# Patient Record
Sex: Male | Born: 1990 | Race: White | Hispanic: No | Marital: Single | Smoking: Never smoker
Health system: Southern US, Community
[De-identification: ages and names within clinical notes are randomized; demographics above are authoritative.]

## PROBLEM LIST (undated history)

## (undated) DIAGNOSIS — J449 Chronic obstructive pulmonary disease, unspecified: Secondary | ICD-10-CM

## (undated) DIAGNOSIS — D839 Common variable immunodeficiency, unspecified: Secondary | ICD-10-CM

## (undated) DIAGNOSIS — J479 Bronchiectasis, uncomplicated: Secondary | ICD-10-CM

---

## 2016-04-14 ENCOUNTER — Emergency Department: Payer: Medicare Other

## 2016-04-14 ENCOUNTER — Encounter: Payer: Self-pay | Admitting: Emergency Medicine

## 2016-04-14 ENCOUNTER — Inpatient Hospital Stay
Admission: EM | Admit: 2016-04-14 | Discharge: 2016-04-22 | DRG: 189 | Disposition: A | Discharge status: Home / Self Care | Payer: Medicare Other | Attending: Internal Medicine | Admitting: Internal Medicine

## 2016-04-14 DIAGNOSIS — Y95 Nosocomial condition: Secondary | ICD-10-CM | POA: Diagnosis present

## 2016-04-14 DIAGNOSIS — Z79891 Long term (current) use of opiate analgesic: Secondary | ICD-10-CM

## 2016-04-14 DIAGNOSIS — J9809 Other diseases of bronchus, not elsewhere classified: Secondary | ICD-10-CM | POA: Diagnosis present

## 2016-04-14 DIAGNOSIS — J9620 Acute and chronic respiratory failure, unspecified whether with hypoxia or hypercapnia: Secondary | ICD-10-CM | POA: Diagnosis present

## 2016-04-14 DIAGNOSIS — R1310 Dysphagia, unspecified: Secondary | ICD-10-CM

## 2016-04-14 DIAGNOSIS — J189 Pneumonia, unspecified organism: Secondary | ICD-10-CM | POA: Diagnosis present

## 2016-04-14 DIAGNOSIS — F329 Major depressive disorder, single episode, unspecified: Secondary | ICD-10-CM | POA: Diagnosis present

## 2016-04-14 DIAGNOSIS — J9622 Acute and chronic respiratory failure with hypercapnia: Secondary | ICD-10-CM | POA: Diagnosis present

## 2016-04-14 DIAGNOSIS — F419 Anxiety disorder, unspecified: Secondary | ICD-10-CM | POA: Diagnosis present

## 2016-04-14 DIAGNOSIS — J9621 Acute and chronic respiratory failure with hypoxia: Principal | ICD-10-CM | POA: Diagnosis present

## 2016-04-14 DIAGNOSIS — E874 Mixed disorder of acid-base balance: Secondary | ICD-10-CM | POA: Diagnosis not present

## 2016-04-14 DIAGNOSIS — K5900 Constipation, unspecified: Secondary | ICD-10-CM | POA: Diagnosis not present

## 2016-04-14 DIAGNOSIS — Z9981 Dependence on supplemental oxygen: Secondary | ICD-10-CM | POA: Diagnosis not present

## 2016-04-14 DIAGNOSIS — R262 Difficulty in walking, not elsewhere classified: Secondary | ICD-10-CM

## 2016-04-14 DIAGNOSIS — D839 Common variable immunodeficiency, unspecified: Secondary | ICD-10-CM | POA: Diagnosis present

## 2016-04-14 DIAGNOSIS — J471 Bronchiectasis with (acute) exacerbation: Secondary | ICD-10-CM | POA: Diagnosis present

## 2016-04-14 DIAGNOSIS — G8929 Other chronic pain: Secondary | ICD-10-CM | POA: Diagnosis present

## 2016-04-14 DIAGNOSIS — E46 Unspecified protein-calorie malnutrition: Secondary | ICD-10-CM | POA: Diagnosis present

## 2016-04-14 DIAGNOSIS — R0602 Shortness of breath: Secondary | ICD-10-CM | POA: Diagnosis present

## 2016-04-14 DIAGNOSIS — J9600 Acute respiratory failure, unspecified whether with hypoxia or hypercapnia: Secondary | ICD-10-CM

## 2016-04-14 DIAGNOSIS — J9601 Acute respiratory failure with hypoxia: Secondary | ICD-10-CM

## 2016-04-14 DIAGNOSIS — K224 Dyskinesia of esophagus: Secondary | ICD-10-CM

## 2016-04-14 DIAGNOSIS — F411 Generalized anxiety disorder: Secondary | ICD-10-CM | POA: Diagnosis not present

## 2016-04-14 DIAGNOSIS — J470 Bronchiectasis with acute lower respiratory infection: Secondary | ICD-10-CM | POA: Diagnosis not present

## 2016-04-14 DIAGNOSIS — M6281 Muscle weakness (generalized): Secondary | ICD-10-CM

## 2016-04-14 DIAGNOSIS — J479 Bronchiectasis, uncomplicated: Secondary | ICD-10-CM

## 2016-04-14 HISTORY — DX: Common variable immunodeficiency, unspecified: D83.9

## 2016-04-14 HISTORY — DX: Chronic obstructive pulmonary disease, unspecified: J44.9

## 2016-04-14 HISTORY — DX: Bronchiectasis, uncomplicated: J47.9

## 2016-04-14 LAB — HEPATIC FUNCTION PANEL
ALBUMIN: 4 g/dL (ref 3.5–5.0)
ALT: 48 U/L (ref 17–63)
AST: 51 U/L — AB (ref 15–41)
Alkaline Phosphatase: 79 U/L (ref 38–126)
Bilirubin, Direct: <0.1 mg/dL — ABNORMAL LOW (ref 0.1–0.5)
TOTAL PROTEIN: 7.5 g/dL (ref 6.5–8.1)
Total Bilirubin: 0.4 mg/dL (ref 0.3–1.2)

## 2016-04-14 LAB — URINALYSIS COMPLETE WITH MICROSCOPIC (ARMC ONLY)
BACTERIA UA: NONE SEEN
BILIRUBIN URINE: NEGATIVE
GLUCOSE, UA: NEGATIVE mg/dL
Hgb urine dipstick: NEGATIVE
Ketones, ur: NEGATIVE mg/dL
Leukocytes, UA: NEGATIVE
Nitrite: NEGATIVE
Protein, ur: NEGATIVE mg/dL
SQUAMOUS EPITHELIAL / LPF: NONE SEEN
Specific Gravity, Urine: 1.006 (ref 1.005–1.030)
pH: 7 (ref 5.0–8.0)

## 2016-04-14 LAB — BASIC METABOLIC PANEL
ANION GAP: 4 — AB (ref 5–15)
BUN: 16 mg/dL (ref 6–20)
CALCIUM: 9.3 mg/dL (ref 8.9–10.3)
CHLORIDE: 97 mmol/L — AB (ref 101–111)
CO2: 35 mmol/L — AB (ref 22–32)
CREATININE: 0.73 mg/dL (ref 0.61–1.24)
GFR calc Af Amer: >60 mL/min (ref >60)
GFR calc non Af Amer: >60 mL/min (ref >60)
GLUCOSE: 104 mg/dL — AB (ref 65–99)
Potassium: 4.6 mmol/L (ref 3.5–5.1)
Sodium: 136 mmol/L (ref 135–145)

## 2016-04-14 LAB — BLOOD GAS, VENOUS
Acid-Base Excess: 8.8 mmol/L — ABNORMAL HIGH (ref 0.0–2.0)
BICARBONATE: 37 mmol/L — AB (ref 20.0–28.0)
Delivery systems: POSITIVE
FIO2: 0.3
MECHANICAL RATE: 10
O2 SAT: 95.4 %
PATIENT TEMPERATURE: 37
PEEP/CPAP: 5 cmH2O
PO2 VEN: 82 mmHg — AB (ref 32.0–45.0)
Pressure support: 10 cmH2O
RATE: 10 resp/min
pCO2, Ven: 67 mmHg — ABNORMAL HIGH (ref 44.0–60.0)
pH, Ven: 7.35 (ref 7.250–7.430)

## 2016-04-14 LAB — CBC
HCT: 45.1 % (ref 40.0–52.0)
HEMOGLOBIN: 15.6 g/dL (ref 13.0–18.0)
MCH: 28.8 pg (ref 26.0–34.0)
MCHC: 34.5 g/dL (ref 32.0–36.0)
MCV: 83.3 fL (ref 80.0–100.0)
Platelets: 252 10*3/uL (ref 150–440)
RBC: 5.41 MIL/uL (ref 4.40–5.90)
RDW: 14.2 % (ref 11.5–14.5)
WBC: 9.8 10*3/uL (ref 3.8–10.6)

## 2016-04-14 LAB — MRSA PCR SCREENING: MRSA BY PCR: NEGATIVE

## 2016-04-14 LAB — TROPONIN I

## 2016-04-14 LAB — LACTIC ACID, PLASMA: Lactic Acid, Venous: 0.7 mmol/L (ref 0.5–1.9)

## 2016-04-14 MED ORDER — ASPIRIN 81 MG PO CHEW: 324.0000 mg | CHEWABLE_TABLET | ORAL | Status: DC

## 2016-04-14 MED ORDER — PREDNISONE 10 MG PO TABS
10.0000 mg | ORAL_TABLET | Freq: Every day | ORAL | Status: DC
  Administered 2016-04-15: 10 mg via ORAL
  Filled 2016-04-14 (×2): qty 1

## 2016-04-14 MED ORDER — CHLORHEXIDINE GLUCONATE 0.12 % MT SOLN
15.0000 mL | Freq: Two times a day (BID) | OROMUCOSAL | Status: DC
  Administered 2016-04-14 – 2016-04-15 (×2): 15 mL via OROMUCOSAL

## 2016-04-14 MED ORDER — ASPIRIN EC 81 MG PO TBEC
81.0000 mg | DELAYED_RELEASE_TABLET | Freq: Every day | ORAL | Status: DC
  Administered 2016-04-15 – 2016-04-16 (×2): 81 mg via ORAL
  Filled 2016-04-14 (×3): qty 1

## 2016-04-14 MED ORDER — ALBUTEROL SULFATE (2.5 MG/3ML) 0.083% IN NEBU
5.0000 mg | INHALATION_SOLUTION | Freq: Once | RESPIRATORY_TRACT | Status: AC
  Administered 2016-04-14: 5 mg via RESPIRATORY_TRACT
  Filled 2016-04-14: qty 6

## 2016-04-14 MED ORDER — VENLAFAXINE HCL ER 37.5 MG PO CP24
37.5000 mg | ORAL_CAPSULE | Freq: Every day | ORAL | Status: DC
  Administered 2016-04-15: 37.5 mg via ORAL
  Filled 2016-04-14 (×2): qty 1

## 2016-04-14 MED ORDER — METOPROLOL TARTRATE 5 MG/5ML IV SOLN
5.0000 mg | Freq: Once | INTRAVENOUS | Status: AC
  Administered 2016-04-14: 5 mg via INTRAVENOUS
  Filled 2016-04-14: qty 5

## 2016-04-14 MED ORDER — METHADONE HCL 10 MG/ML PO CONC
20.0000 mg | Freq: Every day | ORAL | Status: DC
  Administered 2016-04-15 – 2016-04-19 (×5): 20 mg
  Filled 2016-04-14 (×9): qty 2

## 2016-04-14 MED ORDER — FAMOTIDINE IN NACL 20-0.9 MG/50ML-% IV SOLN
20.0000 mg | Freq: Two times a day (BID) | INTRAVENOUS | Status: DC
  Administered 2016-04-14 – 2016-04-15 (×2): 20 mg via INTRAVENOUS
  Filled 2016-04-14 (×2): qty 50

## 2016-04-14 MED ORDER — LACTATED RINGERS IV SOLN
INTRAVENOUS | Status: DC
  Administered 2016-04-14 – 2016-04-16 (×2): via INTRAVENOUS

## 2016-04-14 MED ORDER — ASPIRIN 300 MG RE SUPP: 300.0000 mg | RECTAL | Status: DC

## 2016-04-14 MED ORDER — FENTANYL CITRATE (PF) 100 MCG/2ML IJ SOLN: 100.0000 ug | INTRAMUSCULAR | Status: DC | PRN

## 2016-04-14 MED ORDER — SODIUM CHLORIDE 0.9 % IV SOLN: 250.0000 mL | INTRAVENOUS | Status: DC | PRN

## 2016-04-14 MED ORDER — VANCOMYCIN HCL IN DEXTROSE 1-5 GM/200ML-% IV SOLN
1000.0000 mg | Freq: Once | INTRAVENOUS | Status: AC
  Administered 2016-04-14: 1000 mg via INTRAVENOUS
  Filled 2016-04-14: qty 200

## 2016-04-14 MED ORDER — LEVALBUTEROL HCL 0.63 MG/3ML IN NEBU
0.6300 mg | INHALATION_SOLUTION | RESPIRATORY_TRACT | Status: DC
  Administered 2016-04-15 (×3): 0.63 mg via RESPIRATORY_TRACT
  Filled 2016-04-14 (×3): qty 3

## 2016-04-14 MED ORDER — ACETAMINOPHEN 325 MG PO TABS
650.0000 mg | ORAL_TABLET | ORAL | Status: DC | PRN
  Administered 2016-04-17 – 2016-04-21 (×2): 650 mg via ORAL
  Filled 2016-04-14 (×2): qty 2

## 2016-04-14 MED ORDER — ONDANSETRON HCL 4 MG/2ML IJ SOLN
4.0000 mg | Freq: Four times a day (QID) | INTRAMUSCULAR | Status: DC | PRN
  Administered 2016-04-15 – 2016-04-16 (×3): 4 mg via INTRAVENOUS
  Filled 2016-04-14 (×3): qty 2

## 2016-04-14 MED ORDER — LEVALBUTEROL HCL 0.63 MG/3ML IN NEBU: 0.6300 mg | INHALATION_SOLUTION | Freq: Four times a day (QID) | RESPIRATORY_TRACT | Status: DC | PRN

## 2016-04-14 MED ORDER — ENOXAPARIN SODIUM 40 MG/0.4ML ~~LOC~~ SOLN
40.0000 mg | SUBCUTANEOUS | Status: DC
  Administered 2016-04-14 – 2016-04-21 (×8): 40 mg via SUBCUTANEOUS
  Filled 2016-04-14 (×8): qty 0.4

## 2016-04-14 MED ORDER — MORPHINE SULFATE (PF) 2 MG/ML IV SOLN
INTRAVENOUS | Status: AC
  Filled 2016-04-14: qty 1

## 2016-04-14 MED ORDER — MORPHINE SULFATE (PF) 4 MG/ML IV SOLN
1.0000 mg | INTRAVENOUS | Status: DC | PRN
  Administered 2016-04-14 – 2016-04-16 (×5): 2 mg via INTRAVENOUS
  Administered 2016-04-19: 1 mg via INTRAVENOUS
  Filled 2016-04-14 (×5): qty 1

## 2016-04-14 MED ORDER — ORAL CARE MOUTH RINSE: 15.0000 mL | Freq: Two times a day (BID) | OROMUCOSAL | Status: DC

## 2016-04-14 MED ORDER — IPRATROPIUM-ALBUTEROL 0.5-2.5 (3) MG/3ML IN SOLN
3.0000 mL | Freq: Once | RESPIRATORY_TRACT | Status: AC
  Administered 2016-04-14: 3 mL via RESPIRATORY_TRACT
  Filled 2016-04-14: qty 3

## 2016-04-14 MED ORDER — CEFEPIME-DEXTROSE 1 GM/50ML IV SOLR
1.0000 g | Freq: Once | INTRAVENOUS | Status: AC
  Administered 2016-04-14: 1 g via INTRAVENOUS
  Filled 2016-04-14: qty 50

## 2016-04-14 NOTE — ED Provider Notes (Signed)
[facility] Emergency Department Provider Note    First MD Initiated Contact with Patient 04/14/16 1833     (approximate)  I have reviewed the triage vital signs and the nursing notes.   HISTORY  Chief Complaint Cough and Shortness of Breath    HPI Derrick Maynard is a 26 y.o. male with a history of COPD as well as C VID and bronchiectasis on chronic Bactrim presents with several days of worsening shortness of breath productive cough and generalized malaise. He is typically on 2 L nasal cannula at home. Has been taking nebs every 6 hours. Does have access to BiPAP at home but has been using it nearly 24 7 for the past several days. He presents for worsening shortness of breath and inability to wean off of BiPAP.   Past Medical History:  Diagnosis Date  . Bronchiectasis (HCC)   . COPD (chronic obstructive pulmonary disease) (HCC)   . CVID (common variable immunodeficiency) (HCC)    family history of CVID No recent surgeries There are no active problems to display for this patient.     Prior to Admission medications   Not on File    Allergies Patient has no known allergies.    Social History Social History  Substance Use Topics  . Smoking status: Never Smoker  . Smokeless tobacco: Never Used  . Alcohol use No    Review of Systems Patient denies headaches, rhinorrhea, blurry vision, numbness, shortness of breath, chest pain, edema, cough, abdominal pain, nausea, vomiting, diarrhea, dysuria, fevers, rashes or hallucinations unless otherwise stated above in HPI. ____________________________________________   PHYSICAL EXAM:  VITAL SIGNS: Vitals:   04/14/16 1835 04/14/16 1900  BP: (!) 174/86 (!) 124/91  Pulse: (!) 120 (!) 109  Resp: (!) 24 18  Temp: 98.6 F (37 C)    Constitutional: Alert but in acute respiratory distress Eyes: Conjunctivae are normal. PERRL. EOMI. Head: Atraumatic. Nose: No congestion/rhinnorhea. Mouth/Throat:  Mucous membranes are moist.  Oropharynx non-erythematous. Neck: No stridor. Painless ROM. No cervical spine tenderness to palpation Hematological/Lymphatic/Immunilogical: No cervical lymphadenopathy. Cardiovascular: tachycardic, regular rhythm. Grossly normal heart sounds.  Good peripheral circulation. Respiratory: markedly tachypneic with diffuse wheezing and rhonchorus breath sounds Gastrointestinal: Soft and nontender. No distention. No abdominal bruits. No CVA tenderness. Musculoskeletal: No lower extremity tenderness nor edema.  No joint effusions. Neurologic:  Normal speech and language. No gross focal neurologic deficits are appreciated. No gait instability. Skin:  Skin is warm, dry and intact. No rash noted. Psychiatric: Mood and affect are normal. Speech and behavior are normal.  ____________________________________________   LABS (all labs ordered are listed, but only abnormal results are displayed)  Results for orders placed or performed during the hospital encounter of 04/14/16 (from the past 24 hour(s))  Basic metabolic panel     Status: Abnormal   Collection Time: 04/14/16  6:42 PM  Result Value Ref Range   Sodium 136 135 - 145 mmol/L   Potassium 4.6 3.5 - 5.1 mmol/L   Chloride 97 (L) 101 - 111 mmol/L   CO2 35 (H) 22 - 32 mmol/L   Glucose, Bld 104 (H) 65 - 99 mg/dL   BUN 16 6 - 20 mg/dL   Creatinine, Ser 5.40 0.61 - 1.24 mg/dL   Calcium 9.3 8.9 - 98.1 mg/dL   GFR calc non Af Amer >60 >60 mL/min   GFR calc Af Amer >60 >60 mL/min   Anion gap 4 (L) 5 - 15  CBC  Status: None   Collection Time: 04/14/16  6:42 PM  Result Value Ref Range   WBC 9.8 3.8 - 10.6 K/uL   RBC 5.41 4.40 - 5.90 MIL/uL   Hemoglobin 15.6 13.0 - 18.0 g/dL   HCT 16.145.1 09.640.0 - 04.552.0 %   MCV 83.3 80.0 - 100.0 fL   MCH 28.8 26.0 - 34.0 pg   MCHC 34.5 32.0 - 36.0 g/dL   RDW 40.914.2 81.111.5 - 91.414.5 %   Platelets 252 150 - 440 K/uL  Troponin I     Status: None   Collection Time: 04/14/16  6:42 PM  Result  Value Ref Range   Troponin I <0.03 <0.03 ng/mL  Hepatic function panel     Status: Abnormal   Collection Time: 04/14/16  7:01 PM  Result Value Ref Range   Total Protein 7.5 6.5 - 8.1 g/dL   Albumin 4.0 3.5 - 5.0 g/dL   AST 51 (H) 15 - 41 U/L   ALT 48 17 - 63 U/L   Alkaline Phosphatase 79 38 - 126 U/L   Total Bilirubin 0.4 0.3 - 1.2 mg/dL   Bilirubin, Direct <7.8<0.1 (L) 0.1 - 0.5 mg/dL   Indirect Bilirubin NOT CALCULATED 0.3 - 0.9 mg/dL  Blood gas, venous     Status: Abnormal   Collection Time: 04/14/16  7:12 PM  Result Value Ref Range   FIO2 0.30    Delivery systems BILEVEL POSITIVE AIRWAY PRESSURE    LHR 10 resp/min   Peep/cpap 5.0 cm H20   Pressure support 10 cm H20   pH, Ven 7.35 7.250 - 7.430   pCO2, Ven 67 (H) 44.0 - 60.0 mmHg   pO2, Ven 82.0 (H) 32.0 - 45.0 mmHg   Bicarbonate 37.0 (H) 20.0 - 28.0 mmol/L   Acid-Base Excess 8.8 (H) 0.0 - 2.0 mmol/L   O2 Saturation 95.4 %   Patient temperature 37.0    Collection site VENOUS    Sample type VENOUS    Mechanical Rate 10   Lactic acid, plasma     Status: None   Collection Time: 04/14/16  7:14 PM  Result Value Ref Range   Lactic Acid, Venous 0.7 0.5 - 1.9 mmol/L   ____________________________________________  EKG My review and personal interpretation at Time: 19:07   Indication: SOB  Rate: 100  Rhythm: sinus Axis: normal Other: no acute ischemic changes, normal intervals ____________________________________________  RADIOLOGY  I personally reviewed all radiographic images ordered to evaluate for the above acute complaints and reviewed radiology reports and findings.  These findings were personally discussed with the patient.  Please see medical record for radiology report. ____________________________________________   PROCEDURES  Procedure(s) performed: none Procedures    Critical Care performed: yes CRITICAL CARE Performed by: Willy EddyPatrick Dyllin Gulley   Total critical care time: 50 minutes  Critical care time was  exclusive of separately billable procedures and treating other patients.  Critical care was necessary to treat or prevent imminent or life-threatening deterioration.  Critical care was time spent personally by me on the following activities: development of treatment plan with patient and/or surrogate as well as nursing, discussions with consultants, evaluation of patient's response to treatment, examination of patient, obtaining history from patient or surrogate, ordering and performing treatments and interventions, ordering and review of laboratory studies, ordering and review of radiographic studies, pulse oximetry and re-evaluation of patient's condition.  ____________________________________________   INITIAL IMPRESSION / ASSESSMENT AND PLAN / ED COURSE  Pertinent labs & imaging results that were available during my  care of the patient were reviewed by me and considered in my medical decision making (see chart for details).  DDX: Asthma, copd, CHF, pna, ptx, malignancy, Pe, anemia   Drue Weltman is a 25 y.o. who presents to the ED with acute worsening shortness of breath.  Patient arrives afebrile but tachycardic and in moderate respiratory distress. Patient was placed on BiPAP upon arrival to the ER due to severe shortness of breath despite increasing supple oxygen to 4 L. Has diffuse rhonchorous breath sounds concerning for pneumonia. IV antibiotics ordered for healthcare sister pneumonia as the patient is artery on Bactrim. The eyes are treatments ordered.  Chest x-ray shows diffuse bronchiectasis with likely interval development of right apical pneumonia. Patient with chronic hypercapnia with retention. Symptoms mildly improving with BiPAP. A spoke with ICU physician for admission to the patient's acute respiratory failure with hypoxia.  Have discussed with the patient and available family all diagnostics and treatments performed thus far and all questions were answered to the best of my  ability. The patient demonstrates understanding and agreement with plan.   Clinical Course      ____________________________________________   FINAL CLINICAL IMPRESSION(S) / ED DIAGNOSES  Final diagnoses:  SOB (shortness of breath)  Acute respiratory failure with hypoxia (HCC)      NEW MEDICATIONS STARTED DURING THIS VISIT:  New Prescriptions   No medications on file     Note:  This document was prepared using Dragon voice recognition software and may include unintentional dictation errors.    Willy EddyPatrick Tyesha Joffe, MD 04/14/16 [phone]

## 2016-04-14 NOTE — H&P (Signed)
PULMONARY / CRITICAL CARE MEDICINE   Name: Derrick Maynard MRN: [id number] DOB: 05/14/1991    ADMISSION DATE:  04/14/2016 CONSULTATION DATE:  04/14/2016  REFERRING MD:  Dr. 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  Per notes from [facility] pts lung disease was managed in [place] and he was evaluated twice for [facility], however he was denied due to tracheal pathology.  His sister has identical lung disease and his father died of complications from genetic pulmonary disease in his 7130's.  [place] and [facility] care with [facility] Pulmonologist Dr. Renaldo ReelHuang who saw him on 02/07/2016.  [facility] ER on 11/13 with c/o worsening shortness of breath, frequent productive cough, and generalized malaise over the last several days.  According to pts mother she contacted his pulmonologist on 11/3 due to respiratory symptoms and he was subsequently prescribed a 12 day prednisone taper (completion will be 11/14) and instructed to take bactrim for 14 days.  His mother stated due to respiratory symptoms she increased his O2 from 2L to 4L and noticed improvement of symptoms on 11/12.  However, 11/13 she noticed the pt was in respiratory distress she initially thought the pt had a buildup of CO2 due to  increased oxygen at 4L via nasal canula and placed him on continunous Bipap. She also administered 4 breathing treatments at home without relief of symptoms prompting visit to the ER on 11/13.  PCCM contacted to admit pt due to acute on chronic hypercapnic hypoxic respiratory failure secondary to Acute Exacerbation of Bronchiectasis vs. ?HCAP.  PAST MEDICAL HISTORY :  He  has a past medical history of Bronchiectasis (HCC); COPD (chronic obstructive pulmonary disease) (HCC); and CVID (common variable immunodeficiency) (HCC).  PAST SURGICAL HISTORY: He  has no past surgical history on file.  No Known Allergies  No current facility-administered medications on file prior to encounter.    No current outpatient prescriptions on file prior to encounter.    FAMILY HISTORY:  His has no family status information on file.    SOCIAL HISTORY: He  reports that he has never smoked. He has never used smokeless tobacco. He reports that he does not drink alcohol.  REVIEW OF SYSTEMS:  Positives in BOLD Gen: fever, chills, weight change, fatigue, night sweats HEENT: Denies blurred vision, double vision, hearing loss, tinnitus, sinus congestion, rhinorrhea, sore throat, neck stiffness, dysphagia PULM: shortness of breath, cough, sputum production, hemoptysis, wheezing CV: chest and bilateral flank pain, edema, orthopnea, paroxysmal nocturnal dyspnea, palpitations GI: Denies abdominal pain, nausea, vomiting, diarrhea, hematochezia, melena, constipation, change in bowel habits GU: Denies dysuria, hematuria, polyuria, oliguria, urethral discharge Endocrine: Denies hot or cold intolerance, polyuria, polyphagia or appetite change Derm: Denies rash, dry skin, scaling or peeling skin change Heme: Denies easy bruising, bleeding, bleeding gums Neuro: Denies headache, numbness, weakness, slurred speech, loss of memory  or consciousness  SUBJECTIVE:  Currently on Bipap states he has non radiating chest pain and  bilateral flank pain.  He also states his shortness of breath still remains the same on Bipap.  VITAL SIGNS: BP (!) 124/91   Pulse (!) 109   Temp 98.6 F (37 C) (Oral)   Resp 18   Ht 5\' 5"  (1.651 m)   Wt 125 lb (56.7 kg)   SpO2 98%   BMI 20.80 kg/m   HEMODYNAMICS:    VENTILATOR SETTINGS: FiO2 (%):  [30 %] 30 %  INTAKE / OUTPUT: No intake/output data recorded.  PHYSICAL EXAMINATION: General: Chronically ill appearing Caucasian male  Neuro:  Alert and oriented, following commands, PERRLA HEENT:  Supple, no JVD Cardiovascular:  Sinus tach, s1s2, no M/R/G Lungs:  Faint inspiratory wheezes throughout, slightly labored, even respirations on Bipap Abdomen:  +BS x4, soft, non tender, non distended, clamped micki feeding tube Musculoskeletal:  Normal bulk and tone Skin:  Pale, intact, no rashes or lesions  LABS:  BMET  Recent Labs Lab 04/14/16 1842  NA 136  K 4.6  CL 97*  CO2 35*  BUN 16  CREATININE 0.73  GLUCOSE 104*    Electrolytes  Recent Labs Lab 04/14/16 1842  CALCIUM 9.3    CBC  Recent Labs Lab 04/14/16 1842  WBC 9.8  HGB 15.6  HCT 45.1  PLT 252    Coag's No results for input(s): APTT, INR in the last 168 hours.  Sepsis Markers  Recent Labs Lab 04/14/16 1914  LATICACIDVEN 0.7    ABG No results for input(s): PHART, PCO2ART, PO2ART in the last 168 hours.  Liver Enzymes  Recent Labs Lab 04/14/16 1901  AST 51*  ALT 48  ALKPHOS 79  BILITOT 0.4  ALBUMIN 4.0    Cardiac Enzymes  Recent Labs Lab 04/14/16 1842  TROPONINI <0.03    Glucose No results for input(s): GLUCAP in the last 168 hours.  Imaging Dg Chest Port 1 View  Result Date: 04/14/2016 CLINICAL DATA:  Cough and progressive shortness of breath for 2-3 days. History of COPD, Common variable immunodeficiency. EXAM: PORTABLE CHEST 1 VIEW COMPARISON:  None. FINDINGS: LEFT lung base volume loss and scarring, possible small LEFT pleural effusion with LEFT apical  pleural capping and volume loss. Hyperinflated RIGHT lung. LEFT bronchiectasis. Strandy densities RIGHT upper lobe. Cardiomediastinal silhouette shifted to the LEFT. No pneumothorax. Single lumen RIGHT chest Port-A-Cath, looped at the level of brachiocephalic confluence, distal tip projecting in proximal superior vena cava. Soft tissue planes and included osseous structures are nonsuspicious. IMPRESSION: Bronchiectasis LEFT lung with pleural thickening, and/or small pleural effusion. LEFT lung atelectasis/ scarring. Strandy densities RIGHT upper lobe could be infectious or inflammatory, possible scarring. Electronically Signed   By: Awilda Metro M.D.   On: 04/14/2016 19:44   STUDIES:  None  CULTURES: Blood x2 11/13>> Sputum 11/13>> Urine 11/13>>  ANTIBIOTICS: Cefepime 11/13>> Vancomycin 11/13>>  SIGNIFICANT EVENTS: 11/13-Pt admitted to [facility] ICU due to acute on chronic hypercapnic hypoxic respiratory failure secondary to acute exacerbation of Bronchiectasis vs. ?HCAP  LINES/TUBES: Right chest portacath>> PIV 11/13>>  ASSESSMENT / PLAN:  PULMONARY A: Acute on chronic hypoxic hypercapnic respiratory failure secondary to acute exacerbation of Bronchiectasis vs. 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), Left mainstem bronchial stenosis, Severe obstructive airway disease (FEV1 16%), Respiratory colonization of MSSA and pseudonmonas, M abscessus s/p tx 2012, Chronic home O2 2-3L via nasal canula, and trilogy NIPPV at night P:   Continuous  Bipap for now wean as tolerated Maintain O2 sats >92% Continue scheduled and prn bronchodilators Chest physiotherapy q4hrs Continue outpatient prednisone CXR in am 11/13 Prn ABG's  CARDIOVASCULAR A:  Sinus tachycardia Chest pain P:  Trend troponin's Continuous telemetry monitoring LR @75  ml/hr Maintain map >65  RENAL A:   No acute issues P:   Trend BMP's Monitor UOP Replace  electrolytes as indicated  GASTROINTESTINAL A:   Protein calorie malnutrition associate with chronic respiratory illness P:   Keep NPO for now Pepcid for PUD prophylaxis Dietitian consult once pt off Bipap  HEMATOLOGIC A:   No acute issues P:  Lovenox for VTE prophylaxis Trend CBC's Monitor for s/sx of bleeding Transfuse for hgb <7  INFECTIOUS A:   Chronic immunodeficiency syndrome ?HCAP Hx: Respiratory colonization of MSSA and pseudonmonas, M abscessus s/p tx 2012 P:   Continue abx as listed above Trend WBC's and lactic acid Monitor fever curve Follow cultures  Check influenza panel  ENDOCRINE A:   No acute issues P:   Monitor serum glucose Hyper/hypglycemia protocol  NEUROLOGIC A:   Hx: Chronic Pain (followed by pain clinic, Depression P:   Continue outpatient effexor and methadone Prn Morphine for pain managemen Hold outpatient ativan, oxycodone, remeron, and klonopin for now   FAMILY  - Updates: Pts mother updated about plan of care and questions answered 04/14/16  - Inter-disciplinary family meet or Palliative Care meeting due by:  04/21/2016    Sonda Rumbleana Blakeney, Juel BurrowAGNP  Pulmonary/Critical Care Pager [phone] (please enter 7 digits) PCCM Consult Pager [phone] (please enter 7 digits)   Billy Fischeravid Kimmberly Wisser, MD PCCM service Mobile [phone] Pager [phone] 04/15/2016

## 2016-04-14 NOTE — ED Triage Notes (Signed)
Arrives via [facility] with C/O general sickness, productive cough, and progressively worsening SOB.  Patient has history of CVID, COPD and wears 2L/[place] home o2 and typically takes nebs Q6 hours.  Today patient states he has been taking back to back nebs and has increased his OXygen to 6l/[place]

## 2016-04-14 NOTE — ED Notes (Signed)
Pt brought in via ems from home with sob, cough.  Pt placed in hallway bed and moved to room 16.  Pt pale and is on 4 liters oxygen.  Pt alert.  Pt reports feeling bad for 3 days and worse today.  Pt has chest tightness and wheezing   Pt did 4 breathing treatments at home without much relief.  Mother with pt.   Pt has a micki feeding tube.  Pt states it is used intermittently for pill use but pt does eat food.

## 2016-04-15 ENCOUNTER — Inpatient Hospital Stay: Payer: Medicare Other

## 2016-04-15 DIAGNOSIS — J9622 Acute and chronic respiratory failure with hypercapnia: Secondary | ICD-10-CM

## 2016-04-15 DIAGNOSIS — F411 Generalized anxiety disorder: Secondary | ICD-10-CM

## 2016-04-15 DIAGNOSIS — J9621 Acute and chronic respiratory failure with hypoxia: Principal | ICD-10-CM

## 2016-04-15 DIAGNOSIS — J471 Bronchiectasis with (acute) exacerbation: Secondary | ICD-10-CM

## 2016-04-15 LAB — TROPONIN I: Troponin I: <0.03 ng/mL (ref <0.03)

## 2016-04-15 LAB — EXPECTORATED SPUTUM ASSESSMENT W GRAM STAIN, RFLX TO RESP C

## 2016-04-15 LAB — INFLUENZA PANEL BY PCR (TYPE A & B)
INFLAPCR: NEGATIVE
INFLBPCR: NEGATIVE

## 2016-04-15 LAB — CBC
HCT: 41.9 % (ref 40.0–52.0)
HEMOGLOBIN: 14.4 g/dL (ref 13.0–18.0)
MCH: 28.6 pg (ref 26.0–34.0)
MCHC: 34.3 g/dL (ref 32.0–36.0)
MCV: 83.5 fL (ref 80.0–100.0)
PLATELETS: 227 10*3/uL (ref 150–440)
RBC: 5.01 MIL/uL (ref 4.40–5.90)
RDW: 14.1 % (ref 11.5–14.5)
WBC: 11 10*3/uL — ABNORMAL HIGH (ref 3.8–10.6)

## 2016-04-15 LAB — BASIC METABOLIC PANEL
Anion gap: 6 (ref 5–15)
BUN: 13 mg/dL (ref 6–20)
CALCIUM: 9.2 mg/dL (ref 8.9–10.3)
CO2: 36 mmol/L — AB (ref 22–32)
CREATININE: 0.66 mg/dL (ref 0.61–1.24)
Chloride: 97 mmol/L — ABNORMAL LOW (ref 101–111)
GFR calc Af Amer: >60 mL/min (ref >60)
GFR calc non Af Amer: >60 mL/min (ref >60)
GLUCOSE: 87 mg/dL (ref 65–99)
Potassium: 4.5 mmol/L (ref 3.5–5.1)
Sodium: 139 mmol/L (ref 135–145)

## 2016-04-15 LAB — PHOSPHORUS: Phosphorus: 4 mg/dL (ref 2.5–4.6)

## 2016-04-15 LAB — LACTIC ACID, PLASMA: Lactic Acid, Venous: 0.8 mmol/L (ref 0.5–1.9)

## 2016-04-15 LAB — EXPECTORATED SPUTUM ASSESSMENT W REFEX TO RESP CULTURE

## 2016-04-15 LAB — MAGNESIUM: MAGNESIUM: 2 mg/dL (ref 1.7–2.4)

## 2016-04-15 LAB — GLUCOSE, CAPILLARY: Glucose-Capillary: 93 mg/dL (ref 65–99)

## 2016-04-15 LAB — STREP PNEUMONIAE URINARY ANTIGEN: Strep Pneumo Urinary Antigen: NEGATIVE

## 2016-04-15 MED ORDER — VANCOMYCIN HCL IN DEXTROSE 750-5 MG/150ML-% IV SOLN
750.0000 mg | Freq: Three times a day (TID) | INTRAVENOUS | Status: DC
  Administered 2016-04-15: 750 mg via INTRAVENOUS
  Filled 2016-04-15 (×3): qty 150

## 2016-04-15 MED ORDER — LEVALBUTEROL HCL 0.63 MG/3ML IN NEBU
0.6300 mg | INHALATION_SOLUTION | RESPIRATORY_TRACT | Status: DC | PRN
  Administered 2016-04-16 – 2016-04-17 (×2): 0.63 mg via RESPIRATORY_TRACT
  Filled 2016-04-15 (×2): qty 3

## 2016-04-15 MED ORDER — CEFEPIME-DEXTROSE 2 GM/50ML IV SOLR
2.0000 g | Freq: Three times a day (TID) | INTRAVENOUS | Status: DC
  Administered 2016-04-15 – 2016-04-16 (×4): 2 g via INTRAVENOUS
  Filled 2016-04-15 (×6): qty 50

## 2016-04-15 MED ORDER — ORAL CARE MOUTH RINSE
15.0000 mL | Freq: Two times a day (BID) | OROMUCOSAL | Status: DC
  Administered 2016-04-16 – 2016-04-21 (×10): 15 mL via OROMUCOSAL

## 2016-04-15 MED ORDER — LEVALBUTEROL HCL 0.63 MG/3ML IN NEBU
1.2500 mg | INHALATION_SOLUTION | Freq: Three times a day (TID) | RESPIRATORY_TRACT | Status: DC
  Administered 2016-04-15 – 2016-04-16 (×4): 1.25 mg via RESPIRATORY_TRACT
  Administered 2016-04-17: 1.26 mg via RESPIRATORY_TRACT
  Filled 2016-04-15 (×9): qty 6

## 2016-04-15 MED ORDER — CLONAZEPAM 0.5 MG PO TABS
0.5000 mg | ORAL_TABLET | Freq: Every day | ORAL | Status: DC
  Administered 2016-04-15 – 2016-04-18 (×5): 0.5 mg via ORAL
  Filled 2016-04-15 (×4): qty 1

## 2016-04-15 MED ORDER — METHYLPREDNISOLONE SODIUM SUCC 40 MG IJ SOLR
40.0000 mg | Freq: Two times a day (BID) | INTRAMUSCULAR | Status: DC
  Administered 2016-04-15 – 2016-04-16 (×4): 40 mg via INTRAVENOUS
  Filled 2016-04-15 (×5): qty 1

## 2016-04-15 MED ORDER — FAMOTIDINE 20 MG PO TABS
20.0000 mg | ORAL_TABLET | Freq: Two times a day (BID) | ORAL | Status: DC
  Administered 2016-04-15 – 2016-04-16 (×3): 20 mg via ORAL
  Filled 2016-04-15 (×4): qty 1

## 2016-04-15 MED ORDER — CLONAZEPAM 0.5 MG PO TABS
0.2500 mg | ORAL_TABLET | Freq: Every morning | ORAL | Status: DC
  Administered 2016-04-15 – 2016-04-19 (×5): 0.25 mg via ORAL
  Filled 2016-04-15 (×6): qty 1

## 2016-04-15 MED ORDER — DEXTROSE 5 % IV SOLN
2.0000 g | Freq: Three times a day (TID) | INTRAVENOUS | Status: DC
  Administered 2016-04-15: 2 g via INTRAVENOUS
  Filled 2016-04-15 (×3): qty 2

## 2016-04-15 MED ORDER — VENLAFAXINE HCL ER 75 MG PO CP24
75.0000 mg | ORAL_CAPSULE | Freq: Every day | ORAL | Status: DC
  Administered 2016-04-16 – 2016-04-22 (×7): 75 mg via ORAL
  Filled 2016-04-15 (×7): qty 1

## 2016-04-15 NOTE — Progress Notes (Signed)
Mildly dyspneic when off BiPAP. Very anxious. Denies CP, fever, purulent sputum, hemoptysis, LE edema and calf tenderness.   Vitals:   04/15/16 0852 04/15/16 0900 04/15/16 1000 04/15/16 1100  BP:  (!) 142/89 129/86 129/88  Pulse:  (!) 106 (!) 101 (!) 102  Resp:  20 17 19   Temp:      TempSrc:      SpO2: 100% (!) 89% 100% 99%  Weight:      Height:       Mildly tachypneic HEENT WNL Mild pseudowheeze No true wheezes Diminished BS on L compared to R Reg, no M NABS No C/C/E  BMET    Component Value Date/Time   NA 139 04/15/2016 0427   K 4.5 04/15/2016 0427   CL 97 (L) 04/15/2016 0427   CO2 36 (H) 04/15/2016 0427   GLUCOSE 87 04/15/2016 0427   BUN 13 04/15/2016 0427   CREATININE 0.66 04/15/2016 0427   CALCIUM 9.2 04/15/2016 0427   GFRNONAA >60 04/15/2016 0427   GFRAA >60 04/15/2016 0427   CBC    Component Value Date/Time   WBC 11.0 (H) 04/15/2016 0427   RBC 5.01 04/15/2016 0427   HGB 14.4 04/15/2016 0427   HCT 41.9 04/15/2016 0427   PLT 227 04/15/2016 0427   MCV 83.5 04/15/2016 0427   MCH 28.6 04/15/2016 0427   MCHC 34.3 04/15/2016 0427   RDW 14.1 04/15/2016 0427   Results for orders placed or performed during the hospital encounter of 04/14/16  Blood culture (routine x 2)     Status: None (Preliminary result)   Collection Time: 04/14/16  7:25 PM  Result Value Ref Range Status   Specimen Description BLOOD PORTA CATH  Final   Special Requests BOTTLES DRAWN AEROBIC AND ANAEROBIC 8CC  Final   Culture NO GROWTH < 12 HOURS  Final   Report Status PENDING  Incomplete  Blood culture (routine x 2)     Status: None (Preliminary result)   Collection Time: 04/14/16  7:55 PM  Result Value Ref Range Status   Specimen Description BLOOD PORTA CATH  Final   Special Requests BOTTLES DRAWN AEROBIC AND ANAEROBIC 8CC  Final   Culture NO GROWTH < 12 HOURS  Final   Report Status PENDING  Incomplete  MRSA PCR Screening     Status: None   Collection Time: 04/14/16 10:20 PM  Result  Value Ref Range Status   MRSA by PCR NEGATIVE NEGATIVE Final    Comment:        The GeneXpert MRSA Assay (FDA approved for NASAL specimens only), is one component of a comprehensive MRSA colonization surveillance program. It is not intended to diagnose MRSA infection nor to guide or monitor treatment for MRSA infections.    Anti-infectives    Start     Dose/Rate Route Frequency Ordered Stop   04/15/16 1400  ceFEPIme (MAXIPIME) 2 GM / 50mL IVPB premix     2 g 100 mL/hr over 30 Minutes Intravenous Every 8 hours 04/15/16 0854     04/15/16 0600  ceFEPIme (MAXIPIME) 2 g in dextrose 5 % 50 mL IVPB  Status:  Discontinued     2 g 100 mL/hr over 30 Minutes Intravenous Every 8 hours 04/15/16 0301 04/15/16 0854   04/15/16 0400  vancomycin (VANCOCIN) IVPB 750 mg/150 ml premix  Status:  Discontinued     750 mg 150 mL/hr over 60 Minutes Intravenous Every 8 hours 04/15/16 0125 04/15/16 1150   04/14/16 1945  ceFEPIme (MAXIPIME) 1 GM /  50mL IVPB premix     1 g 100 mL/hr over 30 Minutes Intravenous  Once 04/14/16 1933 04/14/16 2034   04/14/16 1945  vancomycin (VANCOCIN) IVPB 1000 mg/200 mL premix     1,000 mg 200 mL/hr over 60 Minutes Intravenous  Once 04/14/16 1942 04/14/16 2105      CXR: NSC chronic appearing changes on L with volume loss  IMPRESSION: CVID Bronchiectasis Acute on chronic hypoxemic/hpercarbic respiratory failure Anxiety  PLAN: Cont PRN BiPAP Cont supplemental O2 Cont systemic steroids - prednisone increased to IV methylpred Cont nebulized steroids and bronchodilators Cont empiric antibiotics - DC Vanc. Cont cefepime Continue SDU status until no longer requiring BiPAP I have reviewed records [facility] under guidance of Dr Sherrell PullerHuang  Mitchell Epling, MD PCCM service Mobile [phone] Pager [phone] 04/15/2016

## 2016-04-15 NOTE — Progress Notes (Signed)
Key Points: Use following P&T approved IV to PO antibiotic change policy.  Description contains the criteria that are approved Note: Policy Excludes:  Esophagectomy patientsPHARMACIST - PHYSICIAN COMMUNICATION  DR:   Sung AmabileSimonds CONCERNING: IV to Oral Route Change Policy  RECOMMENDATION: This patient is receiving famotidine 20 mg q12 h by the intravenous route.  Based on criteria approved by the Pharmacy and Therapeutics Committee, the intravenous medication(s) is/are being converted to the equivalent oral dose form(s).   DESCRIPTION: These criteria include:  The patient is eating (either orally or via tube) and/or has been taking other orally administered medications for a least 24 hours  The patient has no evidence of active gastrointestinal bleeding or impaired GI absorption (gastrectomy, short bowel, patient on TNA or NPO).  If you have questions about this conversion, please contact the Pharmacy Department   Horris LatinoHolly Gilliam, PharmD Pharmacy Resident 04/15/2016 12:26 PM

## 2016-04-15 NOTE — Progress Notes (Addendum)
Pharmacy Antibiotic Note  Derrick Maynard is a 25 y.o. male admitted on 04/14/2016 with pneumonia.  Pharmacy has been consulted for vancomycin and cefepime dosing.  Plan: DW 56.7kg  Vd 40L kei 0.1 hr-1  T1/2 7 hours Vancomycin 750 mg q 8 hours ordered with stacked dosing. Level before 5th dose. Goal trough 15-20.  Cefepime 2 grams q 8 hours ordered.  Height: 5\' 5"  (165.1 cm) Weight: 125 lb (56.7 kg) IBW/kg (Calculated) : 61.5  Temp (24hrs), Avg:97.7 F (36.5 C), Min:96.8 F (36 C), Max:98.6 F (37 C)   Recent Labs Lab 04/14/16 1842 04/14/16 1914 04/14/16 2316  WBC 9.8  --   --   CREATININE 0.73  --   --   LATICACIDVEN  --  0.7 0.8    Estimated Creatinine Clearance: 115.2 mL/min (by C-G formula based on SCr of 0.73 mg/dL).    No Known Allergies  Antimicrobials this admission: vancomycin 11/13 >>    >>   Dose adjustments this admission:   Microbiology results: 11/13 BCx: pending 11/13 UCx: pending  11/13 Sputum: pending  11/13 MRSA PCR: (-)    11/13 UA: (-)  Thank you for allowing pharmacy to be a part of this patient's care.  Brystol Wasilewski S 04/15/2016 1:28 AM

## 2016-04-15 NOTE — Progress Notes (Signed)
Pharmacy Antibiotic Note  Derrick Maynard is a 25 y.o. male admitted on 04/14/2016 with acute exacerbation of bronchiectasis/ pneumonia.  Pharmacy has been consulted for vancomycin and cefepime dosing. Pt is respiratory colonizer of MSSA and pseudomonas.  Plan: MRSA PCR negative so per rounds, vancomycin was discontinued.  Will continue cefepime 2 grams q 8 hours   Height: 5\' 5"  (165.1 cm) Weight: 119 lb 11.4 oz (54.3 kg) IBW/kg (Calculated) : 61.5  Temp (24hrs), Avg:97.6 F (36.4 C), Min:96.8 F (36 C), Max:98.6 F (37 C)   Recent Labs Lab 04/14/16 1842 04/14/16 1914 04/14/16 2316 04/15/16 0427  WBC 9.8  --   --  11.0*  CREATININE 0.73  --   --  0.66  LATICACIDVEN  --  0.7 0.8  --     Estimated Creatinine Clearance: 110.3 mL/min (by C-G formula based on SCr of 0.66 mg/dL).    No Known Allergies  Antimicrobials this admission: Vancomycin 11/13 >> 11/14 Cefepime 11/13 >>   Dose adjustments this admission:   Microbiology results: 11/13 BCx: pending NG < 12 hours 11/13 UCx: pending 11/13 Sputum: pending  11/13 MRSA PCR: (-)  Thank you for allowing pharmacy to be a part of this patient's care.  Horris LatinoHolly Shawnese Magner, PharmD Pharmacy Resident 04/15/2016 12:21 PM

## 2016-04-15 NOTE — Progress Notes (Signed)
Pt admitted to unit from [facility], on BiPAP, with increased work of breathing. BP on admit 155/112. One time ordered dose of Metoprolol 5mg  given, BP and HR normalized. Pt c/o pain, PRN morphine administered. Pt. Mother at beside throughout night. Pt. Work of breathing decreased, remains on BiPAP at 40%. Appears to be resting comfortably using CPOT scale. Will continue to monitor.

## 2016-04-15 NOTE — Progress Notes (Signed)
Placed pt on BiPAP per pt request. Pt tolerating well at this time. RT will continue to monitor.

## 2016-04-16 LAB — URINE CULTURE: Culture: NO GROWTH

## 2016-04-16 MED ORDER — ENSURE ENLIVE PO LIQD
237.0000 mL | Freq: Three times a day (TID) | ORAL | Status: DC
  Administered 2016-04-16 – 2016-04-22 (×18): 237 mL via ORAL

## 2016-04-16 MED ORDER — IMMUNE GLOBULIN (HUMAN) 4 GM/20ML ~~LOC~~ SOLN
12.0000 g | SUBCUTANEOUS | Status: DC
  Administered 2016-04-17: 12 g via SUBCUTANEOUS

## 2016-04-16 MED ORDER — NON FORMULARY: 12.0000 g | Status: DC

## 2016-04-16 MED ORDER — LORAZEPAM 0.5 MG PO TABS
0.5000 mg | ORAL_TABLET | Freq: Three times a day (TID) | ORAL | Status: DC | PRN
Start: 2016-04-16 — End: 2016-04-22
  Administered 2016-04-21: 03:00:00 0.5 mg via ORAL
  Filled 2016-04-16: qty 1

## 2016-04-16 MED ORDER — CALCIUM CARBONATE ANTACID 500 MG PO CHEW: 1.0000 | CHEWABLE_TABLET | Freq: Three times a day (TID) | ORAL | Status: DC | PRN

## 2016-04-16 MED ORDER — LEVOFLOXACIN IN D5W 750 MG/150ML IV SOLN: 750.0000 mg | INTRAVENOUS | Status: DC

## 2016-04-16 MED ORDER — LEVOFLOXACIN 750 MG PO TABS
750.0000 mg | ORAL_TABLET | Freq: Every day | ORAL | Status: DC
  Administered 2016-04-16 – 2016-04-20 (×5): 750 mg via ORAL
  Filled 2016-04-16: qty 2
  Filled 2016-04-16 (×2): qty 1
  Filled 2016-04-16: qty 2
  Filled 2016-04-16: qty 1

## 2016-04-16 MED ORDER — LORAZEPAM 0.5 MG PO TABS
1.0000 mg | ORAL_TABLET | Freq: Three times a day (TID) | ORAL | Status: DC | PRN
  Administered 2016-04-16: 1 mg via ORAL
  Filled 2016-04-16: qty 2

## 2016-04-16 NOTE — Consult Note (Signed)
[facility] Infectious Disease     Reason for Consult:CVID    Referring Physician: Elder Cyphers Date of Admission:  04/14/2016   Active Problems:   Acute on chronic respiratory failure (HCC)   HPI: Zyeir Hylton is a 25 y.o. male admitted with SOB and cough.  He has bronchiectasis, CVID chronic pain, malnutrition, dysphagia, congenital tracheomegaly with diverticuli, L mainstem bronchus stenosis, Severe obstructive disease and chronic resp failure on home O2.  He has been following with [facility] since moving here from [place].  He had been having increasing SOB, and worsening productive cough for several weeks. He was rxed pred taper and bactrim for 14 days on 11/3 but continued to worsen.  UCX BCX neg, Sputum cx not acceptable.  He had recent + PCR for rhinovirus at [facility] but most recent cx in sept 2017 had only nml flora.  Per report he has had respiratory colonization with MSSA and Pseudomonas in past and has had M abscesses treated in 2012.   Past Medical History:  Diagnosis Date  . Bronchiectasis ([place])   . COPD (chronic obstructive pulmonary disease) ([place])   . CVID (common variable immunodeficiency) ([place])    No past surgical history on file. Social History  Substance Use Topics  . Smoking status: Never Smoker  . Smokeless tobacco: Never Used  . Alcohol use No   No family history on file.  Allergies: No Known Allergies  Current antibiotics: Antibiotics Given (last 72 hours)    Date/Time Action Medication Dose Rate   04/15/16 0415 Given   vancomycin (VANCOCIN) IVPB 750 mg/150 ml premix 750 mg 150 mL/hr   04/15/16 0618 Given   ceFEPIme (MAXIPIME) 2 g in dextrose 5 % 50 mL IVPB 2 g 100 mL/hr   04/15/16 1422 Given   ceFEPIme (MAXIPIME) 2 GM / 27m IVPB premix 2 g 100 mL/hr   04/15/16 2145 Given   ceFEPIme (MAXIPIME) 2 GM / 561mIVPB premix 2 g 100 mL/hr   04/16/16 0520 Given   ceFEPIme (MAXIPIME) 2 GM / 509mVPB premix 2 g 100 mL/hr      MEDICATIONS: . aspirin EC   81 mg Oral Daily  . ceFEPIme  2 g Intravenous Q8H  . clonazePAM  0.25 mg Oral q morning - 10a  . clonazePAM  0.5 mg Oral QHS  . enoxaparin (LOVENOX) injection  40 mg Subcutaneous Q24H  . famotidine  20 mg Oral BID  . feeding supplement (ENSURE ENLIVE)  237 mL Oral TID BM  . levalbuterol  1.25 mg Nebulization Q8H  . mouth rinse  15 mL Mouth Rinse BID  . methadone  20 mg Per Tube Daily  . methylPREDNISolone (SOLU-MEDROL) injection  40 mg Intravenous Q12H  . venlafaxine XR  75 mg Oral Q breakfast    Review of Systems - unable to obtain, sleepy  OBJECTIVE: Temp:  [96.7 F (35.9 C)-98.4 F (36.9 C)] 98.4 F (36.9 C) (11/15 0800) Pulse Rate:  [81-127] 97 (11/15 0900) Resp:  [10-23] 12 (11/15 0900) BP: (118-150)/(78-108) 118/82 (11/15 0900) SpO2:  [92 %-100 %] 92 % (11/15 1306) FiO2 (%):  [4 %-30 %] 30 % (11/15 1306) Weight:  [57.8 kg (127 lb 6.8 oz)] 57.8 kg (127 lb 6.8 oz) (11/15 0500) Physical Exam  Constitutional: very sleepy had received ativan. On 3 L O2, with sat 100% HENT: anicteric Mouth/Throat: Oropharynx is clear and dry. No oropharyngeal exudate.  Cardiovascular: Normal rate, regular rhythm and normal heart sounds.  portacath RU chest wall wnl  Pulmonary/Chest: poor air movement, rhonchi bilaterally Abdominal: Soft. Bowel sounds are normal. He exhibits no distension. There is no tenderness.  Lymphadenopathy: He has no cervical adenopathy.  Neurological: sleepy from ativan e Skin: Skin is warm and dry. No rash noted. No erythema.  Psychiatric: sleepy  LABS: Results for orders placed or performed during the hospital encounter of 04/14/16 (from the past 48 hour(s))  Basic metabolic panel     Status: Abnormal   Collection Time: 04/14/16  6:42 PM  Result Value Ref Range   Sodium 136 135 - 145 mmol/L   Potassium 4.6 3.5 - 5.1 mmol/L   Chloride 97 (L) 101 - 111 mmol/L   CO2 35 (H) 22 - 32 mmol/L   Glucose, Bld 104 (H) 65 - 99 mg/dL   BUN 16 6 - 20 mg/dL   Creatinine,  Ser 0.73 0.61 - 1.24 mg/dL   Calcium 9.3 8.9 - 10.3 mg/dL   GFR calc non Af Amer >60 >60 mL/min   GFR calc Af Amer >60 >60 mL/min    Comment: (NOTE) The eGFR has been calculated using the CKD EPI equation. This calculation has not been validated in all clinical situations. eGFR's persistently <60 mL/min signify possible Chronic Kidney Disease.    Anion gap 4 (L) 5 - 15  CBC     Status: None   Collection Time: 04/14/16  6:42 PM  Result Value Ref Range   WBC 9.8 3.8 - 10.6 K/uL   RBC 5.41 4.40 - 5.90 MIL/uL   Hemoglobin 15.6 13.0 - 18.0 g/dL   HCT 45.1 40.0 - 52.0 %   MCV 83.3 80.0 - 100.0 fL   MCH 28.8 26.0 - 34.0 pg   MCHC 34.5 32.0 - 36.0 g/dL   RDW 14.2 11.5 - 14.5 %   Platelets 252 150 - 440 K/uL  Troponin I     Status: None   Collection Time: 04/14/16  6:42 PM  Result Value Ref Range   Troponin I <0.03 <0.03 ng/mL  Hepatic function panel     Status: Abnormal   Collection Time: 04/14/16  7:01 PM  Result Value Ref Range   Total Protein 7.5 6.5 - 8.1 g/dL   Albumin 4.0 3.5 - 5.0 g/dL   AST 51 (H) 15 - 41 U/L   ALT 48 17 - 63 U/L   Alkaline Phosphatase 79 38 - 126 U/L   Total Bilirubin 0.4 0.3 - 1.2 mg/dL   Bilirubin, Direct <0.1 (L) 0.1 - 0.5 mg/dL   Indirect Bilirubin NOT CALCULATED 0.3 - 0.9 mg/dL  Blood gas, venous     Status: Abnormal   Collection Time: 04/14/16  7:12 PM  Result Value Ref Range   FIO2 0.30    Delivery systems BILEVEL POSITIVE AIRWAY PRESSURE    LHR 10 resp/min   Peep/cpap 5.0 cm H20   Pressure support 10 cm H20   pH, Ven 7.35 7.250 - 7.430   pCO2, Ven 67 (H) 44.0 - 60.0 mmHg   pO2, Ven 82.0 (H) 32.0 - 45.0 mmHg   Bicarbonate 37.0 (H) 20.0 - 28.0 mmol/L   Acid-Base Excess 8.8 (H) 0.0 - 2.0 mmol/L   O2 Saturation 95.4 %   Patient temperature 37.0    Collection site VENOUS    Sample type VENOUS    Mechanical Rate 10   Lactic acid, plasma     Status: None   Collection Time: 04/14/16  7:14 PM  Result Value Ref Range   Lactic Acid, Venous 0.7  0.5 - 1.9 mmol/L  Blood culture (routine x 2)     Status: None (Preliminary result)   Collection Time: 04/14/16  7:25 PM  Result Value Ref Range   Specimen Description BLOOD PORTA CATH    Special Requests BOTTLES DRAWN AEROBIC AND ANAEROBIC 8CC    Culture NO GROWTH 2 DAYS    Report Status PENDING   Blood culture (routine x 2)     Status: None (Preliminary result)   Collection Time: 04/14/16  7:55 PM  Result Value Ref Range   Specimen Description BLOOD PORTA CATH    Special Requests BOTTLES DRAWN AEROBIC AND ANAEROBIC 8CC    Culture NO GROWTH 2 DAYS    Report Status PENDING   Glucose, capillary     Status: None   Collection Time: 04/14/16 10:00 PM  Result Value Ref Range   Glucose-Capillary 93 65 - 99 mg/dL  MRSA PCR Screening     Status: None   Collection Time: 04/14/16 10:20 PM  Result Value Ref Range   MRSA by PCR NEGATIVE NEGATIVE    Comment:        The GeneXpert MRSA Assay (FDA approved for NASAL specimens only), is one component of a comprehensive MRSA colonization surveillance program. It is not intended to diagnose MRSA infection nor to guide or monitor treatment for MRSA infections.   Strep pneumoniae urinary antigen     Status: None   Collection Time: 04/14/16 11:05 PM  Result Value Ref Range   Strep Pneumo Urinary Antigen NEGATIVE NEGATIVE    Comment:        Infection due to S. pneumoniae cannot be absolutely ruled out since the antigen present may be below the detection limit of the test. Performed at [facility]   Culture, Urine     Status: None   Collection Time: 04/14/16 11:05 PM  Result Value Ref Range   Specimen Description URINE, RANDOM    Special Requests NONE    Culture NO GROWTH Performed at [facility]     Report Status 04/16/2016 FINAL   Urinalysis complete, with microscopic ([place] only)     Status: Abnormal   Collection Time: 04/14/16 11:05 PM  Result Value Ref Range   Color, Urine STRAW (A) YELLOW   APPearance CLEAR (A)  CLEAR   Glucose, UA NEGATIVE NEGATIVE mg/dL   Bilirubin Urine NEGATIVE NEGATIVE   Ketones, ur NEGATIVE NEGATIVE mg/dL   Specific Gravity, Urine 1.006 1.005 - 1.030   Hgb urine dipstick NEGATIVE NEGATIVE   pH 7.0 5.0 - 8.0   Protein, ur NEGATIVE NEGATIVE mg/dL   Nitrite NEGATIVE NEGATIVE   Leukocytes, UA NEGATIVE NEGATIVE   RBC / HPF 0-5 0 - 5 RBC/hpf   WBC, UA 0-5 0 - 5 WBC/hpf   Bacteria, UA NONE SEEN NONE SEEN   Squamous Epithelial / LPF NONE SEEN NONE SEEN   Mucous PRESENT   Lactic acid, plasma     Status: None   Collection Time: 04/14/16 11:16 PM  Result Value Ref Range   Lactic Acid, Venous 0.8 0.5 - 1.9 mmol/L  Influenza panel by PCR (type A & B, H1N1)     Status: None   Collection Time: 04/14/16 11:40 PM  Result Value Ref Range   Influenza A By PCR NEGATIVE NEGATIVE   Influenza B By PCR NEGATIVE NEGATIVE    Comment: (NOTE) The Xpert Xpress Flu assay is intended as an aid in the diagnosis of  influenza and should not be used as  a sole basis for treatment.  This  assay is FDA approved for nasopharyngeal swab specimens only. Nasal  washings and aspirates are unacceptable for Xpert Xpress Flu testing.   Troponin I (q 6hr x 3)     Status: None   Collection Time: 04/14/16 11:40 PM  Result Value Ref Range   Troponin I <0.03 <0.03 ng/mL  Troponin I (q 6hr x 3)     Status: None   Collection Time: 04/15/16  4:27 AM  Result Value Ref Range   Troponin I <0.03 <0.03 ng/mL  Basic metabolic panel     Status: Abnormal   Collection Time: 04/15/16  4:27 AM  Result Value Ref Range   Sodium 139 135 - 145 mmol/L   Potassium 4.5 3.5 - 5.1 mmol/L   Chloride 97 (L) 101 - 111 mmol/L   CO2 36 (H) 22 - 32 mmol/L   Glucose, Bld 87 65 - 99 mg/dL   BUN 13 6 - 20 mg/dL   Creatinine, Ser 0.66 0.61 - 1.24 mg/dL   Calcium 9.2 8.9 - 10.3 mg/dL   GFR calc non Af Amer >60 >60 mL/min   GFR calc Af Amer >60 >60 mL/min    Comment: (NOTE) The eGFR has been calculated using the CKD EPI  equation. This calculation has not been validated in all clinical situations. eGFR's persistently <60 mL/min signify possible Chronic Kidney Disease.    Anion gap 6 5 - 15  CBC     Status: Abnormal   Collection Time: 04/15/16  4:27 AM  Result Value Ref Range   WBC 11.0 (H) 3.8 - 10.6 K/uL   RBC 5.01 4.40 - 5.90 MIL/uL   Hemoglobin 14.4 13.0 - 18.0 g/dL   HCT 41.9 40.0 - 52.0 %   MCV 83.5 80.0 - 100.0 fL   MCH 28.6 26.0 - 34.0 pg   MCHC 34.3 32.0 - 36.0 g/dL   RDW 14.1 11.5 - 14.5 %   Platelets 227 150 - 440 K/uL  Magnesium     Status: None   Collection Time: 04/15/16  4:27 AM  Result Value Ref Range   Magnesium 2.0 1.7 - 2.4 mg/dL  Phosphorus     Status: None   Collection Time: 04/15/16  4:27 AM  Result Value Ref Range   Phosphorus 4.0 2.5 - 4.6 mg/dL  Troponin I (q 6hr x 3)     Status: None   Collection Time: 04/15/16 10:40 AM  Result Value Ref Range   Troponin I <0.03 <0.03 ng/mL  Culture, expectorated sputum-assessment     Status: None   Collection Time: 04/15/16  4:22 PM  Result Value Ref Range   Specimen Description SPUTUM    Special Requests NONE    Sputum evaluation      Sputum specimen not acceptable for testing.  Please recollect.   RESULT CALLED TO, READ BACK BY AND VERIFIED WITH: KELSEY WATTS 04/15/16 @ [place]    Report Status 04/15/2016 FINAL    No components found for: ESR, C REACTIVE PROTEIN MICRO: Recent Results (from the past 720 hour(s))  Blood culture (routine x 2)     Status: None (Preliminary result)   Collection Time: 04/14/16  7:25 PM  Result Value Ref Range Status   Specimen Description BLOOD PORTA CATH  Final   Special Requests BOTTLES DRAWN AEROBIC AND ANAEROBIC 8CC  Final   Culture NO GROWTH 2 DAYS  Final   Report Status PENDING  Incomplete  Blood culture (routine x  2)     Status: None (Preliminary result)   Collection Time: 04/14/16  7:55 PM  Result Value Ref Range Status   Specimen Description BLOOD PORTA CATH  Final   Special  Requests BOTTLES DRAWN AEROBIC AND ANAEROBIC 8CC  Final   Culture NO GROWTH 2 DAYS  Final   Report Status PENDING  Incomplete  MRSA PCR Screening     Status: None   Collection Time: 04/14/16 10:20 PM  Result Value Ref Range Status   MRSA by PCR NEGATIVE NEGATIVE Final    Comment:        The GeneXpert MRSA Assay (FDA approved for NASAL specimens only), is one component of a comprehensive MRSA colonization surveillance program. It is not intended to diagnose MRSA infection nor to guide or monitor treatment for MRSA infections.   Culture, Urine     Status: None   Collection Time: 04/14/16 11:05 PM  Result Value Ref Range Status   Specimen Description URINE, RANDOM  Final   Special Requests NONE  Final   Culture NO GROWTH Performed at [facility]   Final   Report Status 04/16/2016 FINAL  Final  Culture, expectorated sputum-assessment     Status: None   Collection Time: 04/15/16  4:22 PM  Result Value Ref Range Status   Specimen Description SPUTUM  Final   Special Requests NONE  Final   Sputum evaluation   Final    Sputum specimen not acceptable for testing.  Please recollect.   RESULT CALLED TO, READ BACK BY AND VERIFIED WITH: KELSEY WATTS 04/15/16 @ [place]    Report Status 04/15/2016 FINAL  Final   02/24/16  Component Name Value Ref Range  Culture Cystic Respiratory 2+ Oropharyngeal flora    Culture Fungus Yeast, not Cryptococcus neoformans (A)  Comment: Expected respiratory flora  Resp panel 9/24  Component Name Value Ref Range  Influenza A RNA Not Detected   Influenza B RNA Not Detected   Respiratory Syncytial Virus (RSV) RNA Not Detected   Parainfluenza 1 RNA Not Detected   Parainfluenza 2 RNA Not Detected   Parainfluenza 3 RNA Not Detected   Human Metapneumovirus RNA Not Detected   Adenovirus DNA Not Detected   Rhinovirus RNA Rhinovirus DETECTED (A)    08/19/2014 CULTURE RESULT Pseudomonas aeruginosa (A)   CULTURE RESULT Candida albicans  (A)   Specimen  Sputum - Mouth  Result Narrative  Culture Report:  3+ Pseudomonas aeruginosa  Rare Candida albicans  2+ Oral Flora  Organism Antibiotic Method Susceptibility  Pseudomonas aeruginosa Amikacin SUSCEPTIBILITY - KB Sensitive   Aztreonam SUSCEPTIBILITY - KB Resistant   Cefepime SUSCEPTIBILITY - KB Intermediate   Ceftazidime SUSCEPTIBILITY - KB Resistant   Ciprofloxacin SUSCEPTIBILITY - KB Sensitive   Gentamicin SUSCEPTIBILITY - KB Sensitive   Meropenem SUSCEPTIBILITY - KB Sensitive   Piperacillin/Tazobactam SUSCEPTIBILITY - KB Intermediate   Tobramycin SUSCEPTIBILITY - KB Sensitive   11/12/2102 SPECIMEN TYPE Sputum   SOURCE BODY SITE Expectorated   CULTURERESULT C Cystic Fibrosis Source: ExpectoratedFinal  CULTURE RESULT: 1+ Staphylococcus aureus  1+ Haemophilus influenzae Beta lactamase negative  This isolate will be susceptible to ampicillin, amoxicillin,  trimethoprim/sulfa, azithromycin, ceftriaxone, and cefpodoxime.  Aspergillus species  3+ Oral Flora  ORGANISM:.............Marland KitchenStaphylococcus aureus CefazolinS ClindamycinR Erythromycin R OxacillinS Penicillin R Trimethoprim/Sulfa S Tetracycline S Vancomycin S       IMAGING: Dg Chest Port 1 View  Result Date: 04/15/2016 CLINICAL DATA:  Respiratory failure. EXAM: PORTABLE CHEST 1 VIEW COMPARISON:  04/14/2016 FINDINGS: Redemonstrated  is single lumen Port-A-Cath, looped in the distal IJ, stable. Normal heart size, with leftward shift due to chronic LEFT lobe volume loss. Chronic pleural thickening is stable. Bronchiectatic changes without definite superimposed active infiltrates. Improved aeration in the RIGHT upper lobe, possible clearing of subsegmental atelectasis. IMPRESSION: Improved aeration.  No active infiltrates or failure. Electronically Signed   By: Staci Righter M.D.   On: 04/15/2016  07:14   Dg Chest Port 1 View  Result Date: 04/14/2016 CLINICAL DATA:  Cough and progressive shortness of breath for 2-3 days. History of COPD, Common variable immunodeficiency. EXAM: PORTABLE CHEST 1 VIEW COMPARISON:  None. FINDINGS: LEFT lung base volume loss and scarring, possible small LEFT pleural effusion with LEFT apical pleural capping and volume loss. Hyperinflated RIGHT lung. LEFT bronchiectasis. Strandy densities RIGHT upper lobe. Cardiomediastinal silhouette shifted to the LEFT. No pneumothorax. Single lumen RIGHT chest Port-A-Cath, looped at the level of brachiocephalic confluence, distal tip projecting in proximal superior vena cava. Soft tissue planes and included osseous structures are nonsuspicious. IMPRESSION: Bronchiectasis LEFT lung with pleural thickening, and/or small pleural effusion. LEFT lung atelectasis/ scarring. Strandy densities RIGHT upper lobe could be infectious or inflammatory, possible scarring. Electronically Signed   By: Elon Alas M.D.   On: 04/14/2016 19:44    Assessment:   Aniceto Kyser is a 25 y.o. male with CVID,advanced lung disease, bronchiectasis, followed at [place], on IVIG admitted with resp distress and cough. Cultures neg, sputum cx not acceptable. Clinically improving and on cefepime.   Recent cultures at [facility] reviewed and no pathogen bacteria noted in Sept, or August, although had rhinovirus in Sept.   Recommendations Would change cefepime to levofloxacin as this will provide coverage of atypical organisms as well as routine bacterial infection. Cont IVIG and treatments prescribed at [facility]  Thank you very much for allowing me to participate in the care of this patient. Please call with questions.   Cheral Marker. Ola Spurr, MD

## 2016-04-16 NOTE — Progress Notes (Signed)
eLink Physician-Brief Progress Note Patient Name: Derrick Maynard DOB: 12/05/1990 MRN: [id number]   Date of Service  04/16/2016  HPI/Events of Note  Admitted earlier with resp distress requiring bipap  eICU Interventions  Cam check - stable off bipap, on 4L [place], no resp distress, pt on his computer, no complaints.      Intervention Category Intermediate Interventions: Respiratory distress - evaluation and management  Dee Paden 04/16/2016, 12:14 AM

## 2016-04-16 NOTE — Progress Notes (Signed)
Pt's mother called to inform me that the pt was texting her from his room stating he is "CO2 drunk". Upon assessment of pt, he requested to be placed back on Bi-PAP. Pt appeared to be very anxious; pt was rubbing his face vigorously and stating that he was hot. Pt did not appear to be in respiratory distress; pt speaking in complete sentences, no change in mental status, pulse ox 98% on 4 LPM . Pt placed on Bi-PAP and Annabelle Harmanana, NP notified of anxiety. Will continue to monitor.

## 2016-04-16 NOTE — Progress Notes (Addendum)
Pharmacy Antibiotic Note  Derrick Maynard is a 25 y.o. male admitted on 04/14/2016 with acute exacerbation of bronchiectasis/ pneumonia.  Pharmacy has been consulted for vancomycin and cefepime dosing. Pt is respiratory colonizer of MSSA and pseudomonas.  Plan: ID has been consulted. Will f/u for stop date. For now, continue cefepime 2 grams q 8 hours   Addendum: Per ID, will transition patient from cefepime to levofloxacin 750 mg PO q 24 hours   Height: 5\' 5"  (165.1 cm) Weight: 127 lb 6.8 oz (57.8 kg) IBW/kg (Calculated) : 61.5  Temp (24hrs), Avg:97.8 F (36.6 C), Min:96.7 F (35.9 C), Max:98.4 F (36.9 C)   Recent Labs Lab 04/14/16 1842 04/14/16 1914 04/14/16 2316 04/15/16 0427  WBC 9.8  --   --  11.0*  CREATININE 0.73  --   --  0.66  LATICACIDVEN  --  0.7 0.8  --     Estimated Creatinine Clearance: 117.4 mL/min (by C-G formula based on SCr of 0.66 mg/dL).    No Known Allergies  Antimicrobials this admission: Vancomycin 11/13 >> 11/14 Cefepime 11/13 >>   Dose adjustments this admission:   Microbiology results: 11/13 BCx: pending NG x 2 days 11/13 UCx: no growth 11/13 Sputum: pending  11/13 MRSA PCR: (-)  Thank you for allowing pharmacy to be a part of this patient's care.  Horris LatinoHolly Eddis [place], PharmD Pharmacy Resident 04/16/2016 1:08 PM

## 2016-04-16 NOTE — Progress Notes (Addendum)
Initial Nutrition Assessment  DOCUMENTATION CODES:   Not applicable  INTERVENTION:  -Recommend Ensure Enlive po BID, each supplement provides 350 kcal and 20 grams of protein -Agree with Regular Diet; pt would likely benefit from smaller, more frequent meals. Pt would benfeit from high calorie, high protein diet instruction. Encourage additives such as butter, sugar, gravies, etc to add more calories to meals.    NUTRITION DIAGNOSIS:   Inadequate oral intake related to chronic illness as evidenced by per patient/family report.  GOAL:   Patient will meet greater than or equal to 90% of their needs  MONITOR:   PO intake, Supplement acceptance, Labs, Weight trends  REASON FOR ASSESSMENT:   Consult Assessment of nutrition requirement/status  ASSESSMENT:    25 yo male admitted with acute on chronic respiratory failure. Pt has PEG tube in place  Pt with flat affect, playing video game on computer this AM, did not appear to want to participate in assessment. Pt reports appetite has been poor for at least 2 months. Pt ate apple this AM for breakfast. Pt unable to tell writer what he typically eats in a day at home.  Pt has G-tube but only uses for some medication at present, does not use for feedings. Pt reports he initially used the G-tube for feedings but he reports difficulty tolerating/discomfort after feedings that he reports could not be resolved. Pt reports he no longer does the feedings because insurance will not pay for them. Pt does report he drinks at least 3 Ensure per day at home. Pt reports he used to weigh 150 pounds but does not know the last time he weighed this. Pt reports wt loss to 125 pounds (reports he weighed this a few weeks ago); current Pt off Bipap at present Labs reviewed Meds: LR at 50 ml/hr, solumedrol  Past Medical History:  Diagnosis Date  . Bronchiectasis (HCC)   . COPD (chronic obstructive pulmonary disease) (HCC)   . CVID (common variable  immunodeficiency) (HCC)    Diet Order:  Diet regular Room service appropriate? Yes; Fluid consistency: Thin  Skin:  Reviewed, no issues  Last BM:  11/14  Height:   Ht Readings from Last 1 Encounters:  04/14/16 5\' 5"  (1.651 m)    Weight:   Wt Readings from Last 1 Encounters:  04/16/16 127 lb 6.8 oz (57.8 kg)    BMI:  Body mass index is 21.2 kg/m.  Estimated Nutritional Needs:   Kcal:  2000-2300 kcals  Protein:  100-115 g  Fluid:  >/= 2 L  EDUCATION NEEDS:   No education needs identified at this time  Romelle StarcherCate Mahad Newstrom MS, RD, LDN [phone] Pager  [phone] Weekend/On-Call Pager

## 2016-04-17 DIAGNOSIS — D839 Common variable immunodeficiency, unspecified: Secondary | ICD-10-CM

## 2016-04-17 LAB — LEGIONELLA PNEUMOPHILA SEROGP 1 UR AG: L. pneumophila Serogp 1 Ur Ag: NEGATIVE

## 2016-04-17 MED ORDER — BISACODYL 5 MG PO TBEC: 5.0000 mg | DELAYED_RELEASE_TABLET | Freq: Every day | ORAL | Status: DC | PRN

## 2016-04-17 MED ORDER — BUDESONIDE 0.25 MG/2ML IN SUSP
0.2500 mg | Freq: Three times a day (TID) | RESPIRATORY_TRACT | Status: DC
  Administered 2016-04-17: 22:00:00 0.25 mg via RESPIRATORY_TRACT
  Filled 2016-04-17 (×2): qty 2

## 2016-04-17 MED ORDER — PREDNISONE 5 MG PO TABS
10.0000 mg | ORAL_TABLET | Freq: Every day | ORAL | Status: DC
  Administered 2016-04-17 – 2016-04-20 (×4): 10 mg via ORAL
  Filled 2016-04-17 (×3): qty 2
  Filled 2016-04-17: qty 1

## 2016-04-17 MED ORDER — LEVALBUTEROL HCL 1.25 MG/0.5ML IN NEBU
1.2500 mg | INHALATION_SOLUTION | Freq: Three times a day (TID) | RESPIRATORY_TRACT | Status: DC
  Administered 2016-04-17: 22:00:00 1.25 mg via RESPIRATORY_TRACT
  Filled 2016-04-17 (×2): qty 0.5

## 2016-04-17 MED ORDER — IPRATROPIUM-ALBUTEROL 0.5-2.5 (3) MG/3ML IN SOLN
3.0000 mL | Freq: Four times a day (QID) | RESPIRATORY_TRACT | Status: DC
Start: 2016-04-17 — End: 2016-04-17
  Administered 2016-04-17: 3 mL via RESPIRATORY_TRACT
  Filled 2016-04-17: qty 3

## 2016-04-17 MED ORDER — BUDESONIDE 0.25 MG/2ML IN SUSP: 0.2500 mg | Freq: Four times a day (QID) | RESPIRATORY_TRACT | Status: DC

## 2016-04-17 MED ORDER — LEVALBUTEROL HCL 0.63 MG/3ML IN NEBU
0.6300 mg | INHALATION_SOLUTION | Freq: Four times a day (QID) | RESPIRATORY_TRACT | Status: DC | PRN
  Administered 2016-04-21: 0.63 mg via RESPIRATORY_TRACT
  Filled 2016-04-17: qty 3

## 2016-04-17 MED ORDER — BUDESONIDE 0.25 MG/2ML IN SUSP
0.2500 mg | Freq: Four times a day (QID) | RESPIRATORY_TRACT | Status: DC
Start: 2016-04-17 — End: 2016-04-17
  Administered 2016-04-17: 0.25 mg via RESPIRATORY_TRACT
  Filled 2016-04-17: qty 2

## 2016-04-17 MED ORDER — ALBUTEROL SULFATE (2.5 MG/3ML) 0.083% IN NEBU: 2.5000 mg | INHALATION_SOLUTION | RESPIRATORY_TRACT | Status: DC | PRN

## 2016-04-17 NOTE — Progress Notes (Signed)
Patient alert and oriented, vital signs stable, sinus tach on cardiac monitor.  No complaint of pain this shift.  Patient with voiding via urinal with adequate output.  On and off home bipap per patient's preference.  Patient currently resting in no apparent distress with Mom at bedside.  [facility] awaiting for room assignment.

## 2016-04-17 NOTE — Evaluation (Signed)
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  Per notes from [facility] pts lung disease [place] and [facility] however he was denied due to tracheal pathology.   Clinical Impression  Pt is able to ambulate better than expected - responded well to cuing for cane usage, breathing awareness and general cadence/gait education.  Pt did have HR increase to the 140s and generally was 125 - 135 t/o most of the effort.  He did report fatigue with the effort, but apparently he has been doing very little recently and did much better than expected.  Pt with good relative balance and confidence with standing, though he was anxious and showed some hesitancy with a few acts.  Overall pt should be able to return home with assist from family once respiratory situation has returned to baseline.     Follow Up Recommendations  ([facility] or similar program)    Equipment Recommendations   (reports walker's too big/bulky, interested in getting a cane)    Recommendations for Other Services       Precautions / Restrictions Precautions Precautions: Fall Restrictions Weight Bearing Restrictions: No      Mobility  Bed Mobility Overal bed  mobility: Independent             General bed mobility comments: Pt able to get in/out of bed w/o issue  Transfers Overall transfer level: Independent Equipment used: None             General transfer comment: Pt able to stand and maintain balance w/o AD. He did feel more comfortable with the cane and ultimately wanted it for security.  Ambulation/Gait Ambulation/Gait assistance: Min guard Ambulation Distance (Feet): 175 Feet Assistive device: Straight cane       General Gait Details: Pt is able to ambulate well with slow, but consistent cadence and no LOBs.  He did need 2 standing rest breaks (HR did get as high as 140) - pt on 4 liters [place] during ambulation and sats stayed in the mid/high 90s t/o the effort.   Stairs            Wheelchair Mobility    Modified Rankin (Stroke Patients Only)       Balance Overall balance assessment: Modified Independent                                           Pertinent Vitals/Pain Pain Assessment: No/denies pain    Home Living Family/patient expects to be discharged to:: Private residence Living Arrangements: Parent;Other relatives Available Help at Discharge: Family   Home Access: Stairs to enter       Home Equipment: Walker - 4 wheels (C-pap, O2)      Prior Function  Level of Independence: Independent         Comments: Pt is normally limited with community activity, able to do in home ambulation, etc.  [facility] to [facility].     Hand Dominance        Extremity/Trunk Assessment   Upper Extremity Assessment: Overall WFL for tasks assessed           Lower Extremity Assessment: Overall WFL for tasks assessed         Communication   Communication: No difficulties  Cognition Arousal/Alertness: Awake/alert Behavior During Therapy: Anxious Overall Cognitive Status: Within Functional Limits for tasks assessed                       General Comments      Exercises     Assessment/Plan    PT Assessment Patient needs continued PT services  PT Problem List Decreased strength;Decreased range of motion;Decreased activity tolerance;Decreased balance;Decreased mobility;Decreased coordination;Decreased safety awareness;Decreased knowledge of use of DME          PT Treatment Interventions DME instruction;Gait training;Functional mobility training;Therapeutic activities;Therapeutic exercise;Balance training    PT Goals (Current goals can be found in the Care Plan section)  Acute Rehab PT Goals Patient Stated Goal: get back home and be more active PT Goal Formulation: With patient Time For Goal Achievement: 05/01/16 Potential to Achieve Goals: Fair    Frequency Min 2X/week   Barriers to discharge        Co-evaluation               End of Session Equipment Utilized During Treatment: Oxygen;Gait belt Activity Tolerance: Patient limited by fatigue Patient left: in bed;with call bell/phone within reach;with family/visitor present Nurse Communication: Mobility status (vitals during activity)         Time: [id number] PT Time Calculation (min) (ACUTE ONLY): 51 min   Charges:   PT Evaluation $PT Eval Low Complexity: 1 Procedure PT Treatments $Gait Training: 8-22 mins   PT G Codes:        Malachi ProGalen R Samhita Kretsch, DPT 04/17/2016, 4:56 PM

## 2016-04-17 NOTE — Progress Notes (Signed)
Brief Overview: acute on chronic hypercapnic hypoxic respiratory failure secondary to Acute Exacerbation of Bronchiectasis vs. ?HCAP  SUBJECTIVE States his breathing has improved only complaint is constipation Vitals:   04/17/16 0500 04/17/16 0632 04/17/16 0721 04/17/16 0800  BP: (!) 139/93 (!) 138/91  (!) 127/98  Pulse: (!) 111 (!) 111  100  Resp: 16 11  17   Temp:    98.1 F (36.7 C)  TempSrc:    Oral  SpO2: 98% 97% 94% 98%  Weight: 134 lb 7.7 oz (61 kg)     Height:       PHYSICAL EXAMINATION General: Chronically ill appearing Caucasian male  Neuro:  Alert and oriented, following commands, PERRLA HEENT:  Supple, no JVD Cardiovascular:  Sinus tach, s1s2, no M/R/G Lungs: Faint pseudowheezes throughout, slightly labor, even respirations on Trilogy Abdomen:  +BS x4, soft, tender, non distended, clamped micki feeding tube Musculoskeletal:  Normal bulk and tone Skin:  Pale, intact, no rashes or lesions  BMET    Component Value Date/Time   NA 139 04/15/2016 0427   K 4.5 04/15/2016 0427   CL 97 (L) 04/15/2016 0427   CO2 36 (H) 04/15/2016 0427   GLUCOSE 87 04/15/2016 0427   BUN 13 04/15/2016 0427   CREATININE 0.66 04/15/2016 0427   CALCIUM 9.2 04/15/2016 0427   GFRNONAA >60 04/15/2016 0427   GFRAA >60 04/15/2016 0427   CBC    Component Value Date/Time   WBC 11.0 (H) 04/15/2016 0427   RBC 5.01 04/15/2016 0427   HGB 14.4 04/15/2016 0427   HCT 41.9 04/15/2016 0427   PLT 227 04/15/2016 0427   MCV 83.5 04/15/2016 0427   MCH 28.6 04/15/2016 0427   MCHC 34.3 04/15/2016 0427   RDW 14.1 04/15/2016 0427   Results for orders placed or performed during the hospital encounter of 04/14/16  Blood culture (routine x 2)     Status: None (Preliminary result)   Collection Time: 04/14/16  7:25 PM  Result Value Ref Range Status   Specimen Description BLOOD PORTA CATH  Final   Special Requests BOTTLES DRAWN AEROBIC AND ANAEROBIC 8CC  Final   Culture NO GROWTH 3 DAYS  Final   Report  Status PENDING  Incomplete  Blood culture (routine x 2)     Status: None (Preliminary result)   Collection Time: 04/14/16  7:55 PM  Result Value Ref Range Status   Specimen Description BLOOD PORTA CATH  Final   Special Requests BOTTLES DRAWN AEROBIC AND ANAEROBIC 8CC  Final   Culture NO GROWTH 3 DAYS  Final   Report Status PENDING  Incomplete  MRSA PCR Screening     Status: None   Collection Time: 04/14/16 10:20 PM  Result Value Ref Range Status   MRSA by PCR NEGATIVE NEGATIVE Final    Comment:        The GeneXpert MRSA Assay (FDA approved for NASAL specimens only), is one component of a comprehensive MRSA colonization surveillance program. It is not intended to diagnose MRSA infection nor to guide or monitor treatment for MRSA infections.   Culture, Urine     Status: None   Collection Time: 04/14/16 11:05 PM  Result Value Ref Range Status   Specimen Description URINE, RANDOM  Final   Special Requests NONE  Final   Culture NO GROWTH [facility]   Final   Report Status 04/16/2016 FINAL  Final  Culture, expectorated sputum-assessment     Status: None   Collection Time: 04/15/16  4:22 PM  Result Value Ref Range Status   Specimen Description SPUTUM  Final   Special Requests NONE  Final   Sputum evaluation   Final    Sputum specimen not acceptable for testing.  Please recollect.   RESULT CALLED TO, READ BACK BY AND VERIFIED WITH: KELSEY WATTS 04/15/16 @ 1840  MLK    Report Status 04/15/2016 FINAL  Final   Anti-infectives    Start     Dose/Rate Route Frequency Ordered Stop   04/16/16 1800  levofloxacin (LEVAQUIN) tablet 750 mg     750 mg Oral Daily 04/16/16 1605     04/16/16 1600  levofloxacin (LEVAQUIN) IVPB 750 mg  Status:  Discontinued     750 mg 100 mL/hr over 90 Minutes Intravenous Every 24 hours 04/16/16 1550 04/16/16 1551   04/15/16 1400  ceFEPIme (MAXIPIME) 2 GM / 50mL IVPB premix  Status:  Discontinued     2 g 100 mL/hr over 30 Minutes  Intravenous Every 8 hours 04/15/16 0854 04/16/16 1551   04/15/16 0600  ceFEPIme (MAXIPIME) 2 g in dextrose 5 % 50 mL IVPB  Status:  Discontinued     2 g 100 mL/hr over 30 Minutes Intravenous Every 8 hours 04/15/16 0301 04/15/16 0854   04/15/16 0400  vancomycin (VANCOCIN) IVPB 750 mg/150 ml premix  Status:  Discontinued     750 mg 150 mL/hr over 60 Minutes Intravenous Every 8 hours 04/15/16 0125 04/15/16 1150   04/14/16 1945  ceFEPIme (MAXIPIME) 1 GM / 50mL IVPB premix     1 g 100 mL/hr over 30 Minutes Intravenous  Once 04/14/16 1933 04/14/16 2034   04/14/16 1945  vancomycin (VANCOCIN) IVPB 1000 mg/200 mL premix     1,000 mg 200 mL/hr over 60 Minutes Intravenous  Once 04/14/16 1942 04/14/16 2105      CXR: NSC chronic appearing changes on L with volume loss  IMPRESSION: CVID Bronchiectasis Acute on chronic hypoxemic/hpercarbic respiratory failure Anxiety  PLAN: Cont outpatient trilogy prn and qhs  Cont supplemental O2 Maintain O2 sats >92% Cont po prednisone Cont nebulized steroids and bronchodilators Infectious Disease consulted appreciate input Continue Levaquin per Infectious Disease recommendations Continue weekly IVIG Continue prn po ativan  Prn Dulcolax for constipation Continue pulmonary hygiene Consult physical therapy  Sonda Rumbleana Blakeney, AGNP  Pulmonary/Critical Care Pager [phone] (please enter 7 digits) PCCM Consult Pager [phone] (please enter [phone])    PCCM ATTENDING ATTESTATION: I have evaluated patient with the APP Blakeney, reviewed database in its entirety and discussed care plan in detail. In addition, this patient was discussed on multidisciplinary rounds.   Important exam findings: Less dyspneic Minimal rhonchi No whezes  PLAN/REC: Transfer to med-surg Taper steroids Add budesonide Cont neb BDs Anticipate DC home in next 24-48 hours   Billy Fischeravid Jaidyn Kuhl, MD PCCM service Mobile [phone] Pager [phone]

## 2016-04-17 NOTE — Progress Notes (Signed)
Patient with room assignment to 103, [facility] with no further questions.  Patient belongings packed as well as triology machine to be sent with patient.  Family and patient updated.

## 2016-04-18 DIAGNOSIS — D839 Common variable immunodeficiency, unspecified: Secondary | ICD-10-CM

## 2016-04-18 LAB — URINALYSIS COMPLETE WITH MICROSCOPIC (ARMC ONLY)
BILIRUBIN URINE: NEGATIVE
Bacteria, UA: NONE SEEN
GLUCOSE, UA: NEGATIVE mg/dL
HGB URINE DIPSTICK: NEGATIVE
Ketones, ur: NEGATIVE mg/dL
LEUKOCYTES UA: NEGATIVE
NITRITE: NEGATIVE
Protein, ur: 30 mg/dL — AB
SPECIFIC GRAVITY, URINE: 1.014 (ref 1.005–1.030)
Squamous Epithelial / LPF: NONE SEEN
pH: 9 — ABNORMAL HIGH (ref 5.0–8.0)

## 2016-04-18 LAB — BLOOD GAS, ARTERIAL
ACID-BASE EXCESS: 20 mmol/L — AB (ref 0.0–2.0)
ALLENS TEST (PASS/FAIL): POSITIVE — AB
Bicarbonate: 50.9 mmol/L — ABNORMAL HIGH (ref 20.0–28.0)
Delivery systems: POSITIVE
EXPIRATORY PAP: 8
FIO2: 36
INSPIRATORY PAP: 12
O2 Saturation: 97.3 %
PCO2 ART: 88 mmHg — AB (ref 32.0–48.0)
PO2 ART: 97 mmHg (ref 83.0–108.0)
Patient temperature: 37
pH, Arterial: 7.37 (ref 7.350–7.450)

## 2016-04-18 MED ORDER — LEVALBUTEROL HCL 1.25 MG/0.5ML IN NEBU
1.2500 mg | INHALATION_SOLUTION | Freq: Three times a day (TID) | RESPIRATORY_TRACT | Status: DC
  Administered 2016-04-18 – 2016-04-22 (×13): 1.25 mg via RESPIRATORY_TRACT
  Filled 2016-04-18 (×16): qty 0.5

## 2016-04-18 MED ORDER — BUDESONIDE 0.25 MG/2ML IN SUSP
0.2500 mg | Freq: Three times a day (TID) | RESPIRATORY_TRACT | Status: DC
  Administered 2016-04-18 – 2016-04-22 (×13): 0.25 mg via RESPIRATORY_TRACT
  Filled 2016-04-18 (×15): qty 2

## 2016-04-18 MED ORDER — PANTOPRAZOLE SODIUM 40 MG PO TBEC
40.0000 mg | DELAYED_RELEASE_TABLET | Freq: Every day | ORAL | Status: DC
  Administered 2016-04-18 – 2016-04-22 (×5): 40 mg via ORAL
  Filled 2016-04-18 (×5): qty 1

## 2016-04-18 MED ORDER — VITAMIN D 1000 UNITS PO TABS
1000.0000 [IU] | ORAL_TABLET | Freq: Every day | ORAL | Status: DC
  Administered 2016-04-18 – 2016-04-22 (×5): 1000 [IU] via ORAL
  Filled 2016-04-18 (×5): qty 1

## 2016-04-18 NOTE — Progress Notes (Signed)
[facility] INFECTIOUS DISEASE PROGRESS NOTE Date of Admission:  04/14/2016     ID: Derrick Maynard is a 25 y.o. male with CVID Active Problems:   Acute on chronic respiratory failure (HCC)   Subjective: Out of unit. Reports still feeling heart pounding and feels like he has to stay on bipap or he becomes confused. Not producing much sputum. No fevers Reports some dysuria and freq  ROS  Eleven systems are reviewed and negative except per hpi  Medications:  Antibiotics Given (last 72 hours)    Date/Time Action Medication Dose Rate   04/15/16 1422 Given   ceFEPIme (MAXIPIME) 2 GM / 50mL IVPB premix 2 g 100 mL/hr   04/15/16 2145 Given   ceFEPIme (MAXIPIME) 2 GM / 50mL IVPB premix 2 g 100 mL/hr   04/16/16 0520 Given   ceFEPIme (MAXIPIME) 2 GM / 50mL IVPB premix 2 g 100 mL/hr   04/16/16 1400 Given   ceFEPIme (MAXIPIME) 2 GM / 50mL IVPB premix 2 g 100 mL/hr   04/16/16 1801 Given   levofloxacin (LEVAQUIN) tablet 750 mg 750 mg    04/17/16 1807 Given   levofloxacin (LEVAQUIN) tablet 750 mg 750 mg      . budesonide (PULMICORT) nebulizer solution  0.25 mg Nebulization Q8H  . cholecalciferol  1,000 Units Oral Daily  . clonazePAM  0.25 mg Oral q morning - 10a  . clonazePAM  0.5 mg Oral QHS  . enoxaparin (LOVENOX) injection  40 mg Subcutaneous Q24H  . feeding supplement (ENSURE ENLIVE)  237 mL Oral TID BM  . Immune Globulin (Human)  12 g Subcutaneous Weekly  . levalbuterol  1.25 mg Nebulization Q8H  . levofloxacin  750 mg Oral Daily  . mouth rinse  15 mL Mouth Rinse BID  . methadone  20 mg Per Tube Daily  . pantoprazole  40 mg Oral Daily  . predniSONE  10 mg Oral Q breakfast  . venlafaxine XR  75 mg Oral Q breakfast    Objective: Vital signs in last 24 hours: Temp:  [97.8 F (36.6 C)-98.3 F (36.8 C)] 97.8 F (36.6 C) (11/17 0500) Pulse Rate:  [95-114] 95 (11/17 0500) Resp:  [11-19] 19 (11/16 1912) BP: (128-145)/(86-95) 128/86 (11/17 0500) SpO2:  [92 %-100 %] 99 % (11/17  0750) Weight:  [59 kg (130 lb)] 59 kg (130 lb) (11/16 1912) Physical Exam  Constitutional: drowsy . OnTrilogy HENT: anicteric Mouth/Throat: Oropharynx is clear and dry. No oropharyngeal exudate.  Cardiovascular: Normal rate, regular rhythm and normal heart sounds.  portacath RU chest wall wnl Feeding tube in lower quadrant Pulmonary/Chest: poor air movement, rhonchi bilaterally Abdominal: Soft. Bowel sounds are normal. He exhibits no distension. There is no tenderness.  Lymphadenopathy: He has no cervical adenopathy.  Neurological: drowy Skin: Skin is warm and dry. No rash noted. No erythema.  Psychiatric: anxious. drowsy  Lab Results No results for input(s): WBC, HGB, HCT, NA, K, CL, CO2, BUN, CREATININE, GLU in the last 72 hours.  Invalid input(s): PLATELETS  Microbiology: Results for orders placed or performed during the hospital encounter of 04/14/16  Blood culture (routine x 2)     Status: None (Preliminary result)   Collection Time: 04/14/16  7:25 PM  Result Value Ref Range Status   Specimen Description BLOOD PORTA CATH  Final   Special Requests BOTTLES DRAWN AEROBIC AND ANAEROBIC 8CC  Final   Culture NO GROWTH 4 DAYS  Final   Report Status PENDING  Incomplete  Blood culture (routine x 2)  Status: None (Preliminary result)   Collection Time: 04/14/16  7:55 PM  Result Value Ref Range Status   Specimen Description BLOOD PORTA CATH  Final   Special Requests BOTTLES DRAWN AEROBIC AND ANAEROBIC 8CC  Final   Culture NO GROWTH 4 DAYS  Final   Report Status PENDING  Incomplete  MRSA PCR Screening     Status: None   Collection Time: 04/14/16 10:20 PM  Result Value Ref Range Status   MRSA by PCR NEGATIVE NEGATIVE Final    Comment:        The GeneXpert MRSA Assay (FDA approved for NASAL specimens only), is one component of a comprehensive MRSA colonization surveillance program. It is not intended to diagnose MRSA infection nor to guide or monitor treatment for MRSA  infections.   Culture, Urine     Status: None   Collection Time: 04/14/16 11:05 PM  Result Value Ref Range Status   Specimen Description URINE, RANDOM  Final   Special Requests NONE  Final   Culture NO GROWTH Performed at [facility]   Final   Report Status 04/16/2016 FINAL  Final  Culture, expectorated sputum-assessment     Status: None   Collection Time: 04/15/16  4:22 PM  Result Value Ref Range Status   Specimen Description SPUTUM  Final   Special Requests NONE  Final   Sputum evaluation   Final    Sputum specimen not acceptable for testing.  Please recollect.   RESULT CALLED TO, READ BACK BY AND VERIFIED WITH: KELSEY WATTS 04/15/16 @ 1840  MLK    Report Status 04/15/2016 FINAL  Final    Studies/Results: Dg Chest Port 1 View  Result Date: 04/15/2016 CLINICAL DATA:  Respiratory failure. EXAM: PORTABLE CHEST 1 VIEW COMPARISON:  04/14/2016 FINDINGS: Redemonstrated is single lumen Port-A-Cath, looped in the distal IJ, stable. Normal heart size, with leftward shift due to chronic LEFT lobe volume loss. Chronic pleural thickening is stable. Bronchiectatic changes without definite superimposed active infiltrates. Improved aeration in the RIGHT upper lobe, possible clearing of subsegmental atelectasis. IMPRESSION: Improved aeration.  No active infiltrates or failure. Electronically Signed   By: Elsie Stain M.D.   On: 04/15/2016 07:14   Dg Chest Port 1 View  Result Date: 04/14/2016 CLINICAL DATA:  Cough and progressive shortness of breath for 2-3 days. History of COPD, Common variable immunodeficiency. EXAM: PORTABLE CHEST 1 VIEW COMPARISON:  None. FINDINGS: LEFT lung base volume loss and scarring, possible small LEFT pleural effusion with LEFT apical pleural capping and volume loss. Hyperinflated RIGHT lung. LEFT bronchiectasis. Strandy densities RIGHT upper lobe. Cardiomediastinal silhouette shifted to the LEFT. No pneumothorax. Single lumen RIGHT chest Port-A-Cath, looped at  the level of brachiocephalic confluence, distal tip projecting in proximal superior vena cava. Soft tissue planes and included osseous structures are nonsuspicious. IMPRESSION: Bronchiectasis LEFT lung with pleural thickening, and/or small pleural effusion. LEFT lung atelectasis/ scarring. Strandy densities RIGHT upper lobe could be infectious or inflammatory, possible scarring. Electronically Signed   By: Awilda Metro M.D.   On: 04/14/2016 19:44    Assessment/Plan: Derrick Maynard is a 25 y.o. male with CVID,advanced lung disease, bronchiectasis, followed at [facility], on IVIG admitted with resp distress and cough. Cultures neg, sputum cx not acceptable. Clinically improving and on cefepime but levofloxacin.   Recent cultures at [facility] reviewed and no pathogen bacteria noted in Sept, or August, although had rhinovirus in Sept.   Urine legionella, and strep PNA ag negative. Flu PCR negative Recommendations Would continue levofloxacin  for total 7 day course then stop Cont IVIG and [facility] Will check UA today given some dysuria. I gave him my info in case he needs to see local ID. No need to schedule follow up at this time however. He would be best to continue to follow up with [facility]  Thank you very much for the consult. Will follow with you.  Brentley Landfair P   04/18/2016, 1:56 PM

## 2016-04-18 NOTE — Care Management Important Message (Signed)
Important Message  Patient Details  Name: Derrick Maynard MRN: [id number] Date of Birth: 01/21/1991   Medicare Important Message Given:  Yes    Gwenette GreetBrenda S Jaren Kearn, RN 04/18/2016, 8:52 AM

## 2016-04-18 NOTE — Progress Notes (Signed)
eLink Physician-Brief Progress Note Patient Name: Derrick AsaMicah Maynard DOB: 12/26/1990 MRN: [id number]   Date of Service  04/18/2016  HPI/Events of Note  Patient requests ABG d/t feeling sleepy and dizzy.   eICU Interventions  Will draw ABG now.     Intervention Category Intermediate Interventions: Respiratory distress - evaluation and management  Thuy Atilano Eugene 04/18/2016, 3:56 PM

## 2016-04-18 NOTE — Progress Notes (Signed)
While rounding, CH made initial visit to room 103. Pt was alert young man and seeking conversation though it was a bit labored because of his breathing apparatus. We spoke on a wide range of topics, from gaming to music. Pt said that he and his family had moved here recently. He said that he has been in and out of hospitals since he was 25 years old because of his condition. Pt enjoyed our visit, and I will follow up with him on my next rounding. CH is available for follow up as needed.    04/18/16 1400  Clinical Encounter Type  Visited With Patient  Visit Type Initial;Spiritual support;Social support  Referral From Nurse  Spiritual Encounters  Spiritual Needs Other (Comment) (Companionship )

## 2016-04-18 NOTE — Progress Notes (Signed)
Lethargic. No distress. Cognition intact. No new complanits  Vitals:   04/17/16 1912 04/17/16 2140 04/18/16 0500 04/18/16 0750  BP: (!) 138/95  128/86   Pulse: (!) 103  95   Resp: 19     Temp: 98.3 F (36.8 C)  97.8 F (36.6 C)   TempSrc: Oral  Oral   SpO2: 99% 100% 100% 99%  Weight: 130 lb (59 kg)     Height: 5\' 5"  (1.651 m)      PHYSICAL EXAMINATION General: NAD Neuro: No focal deficits HEENT: WNL Cardiovascular: reg, no M Lungs: few scattered wheezes Abdomen: soft, NT, +BS Ext: no edema  BMP Latest Ref Rng & Units 04/15/2016 04/14/2016  Glucose 65 - 99 mg/dL 87 161(W104(H)  BUN 6 - 20 mg/dL 13 16  Creatinine 9.600.61 - 1.24 mg/dL 4.540.66 0.980.73  Sodium 119135 - 145 mmol/L 139 136  Potassium 3.5 - 5.1 mmol/L 4.5 4.6  Chloride 101 - 111 mmol/L 97(L) 97(L)  CO2 22 - 32 mmol/L 36(H) 35(H)  Calcium 8.9 - 10.3 mg/dL 9.2 9.3   CBC Latest Ref Rng & Units 04/15/2016 04/14/2016  WBC 3.8 - 10.6 K/uL 11.0(H) 9.8  Hemoglobin 13.0 - 18.0 g/dL 14.714.4 82.915.6  Hematocrit 56.240.0 - 52.0 % 41.9 45.1  Platelets 150 - 440 K/uL 227 252   No new CXR  IMPRESSION: CVID Bronchiectasis Acute on chronic hypoxemic/hpercarbic respiratory failure Anxiety, controlled  PLAN: Cont outpatient Trilogy vent prn and qhs  Cont supplemental O2 to maintain O2 sats >92% Cont po prednisone @ 10 mg daily Cont nebulized steroids and bronchodilators Complete 10 days levofloxacin Continue weekly IgG Cont low dose cloanzepam Prn Dulcolax for constipation Continue airway hygiene Cont PT  He does not feel ready for discharge to home today  His discharge regimen will be the same as prior to this hospitalization with the following additions: 1) prednisone 10 mg daily X 4 days 2) Levofloxacin 500 mg daily through 11/23 3) Budesonide 0.5 mg nebulized BID  He should go home with flutter valve in addition to his previous airway hygiene devices  I have requested follow up with me in the office in 3-4 weeks after discharge  with CXR same day  Billy Fischeravid Simonds, MD PCCM service Mobile [phone] Pager [phone] 04/18/2016

## 2016-04-18 NOTE — Care Management (Signed)
Admitted to [facility] ICU with the diagnosis of acute on chronic respiratory failure. Lives with parents. Mother is Jodean LimaShawna Gentert [phone]). [place]oved [place] 6 months ago. Last seen Dr. Zada Finderslmedo several months ago. Home Health in [place]. No skilled facility. Oxygen 3 liters per nasal cannula. Uses BiPap at night 4 liters. Rolling Walker in the home. Needs Bed side commode. No falls (stumbles). Decreased appetite. Usually in the bed, but does get up to go to the bathroom. Prescriptions are filled at [facility]. Family will transport.   Gwenette GreetBrenda S Tranika Scholler RN MSN CCM Care Management

## 2016-04-18 NOTE — Progress Notes (Signed)
eLink Physician-Brief Progress Note Patient Name: Annye AsaMicah Cliburn DOB: 11/24/1990 MRN: [id number]   Date of Service  04/18/2016  HPI/Events of Note  ABG on BiPAP = 7.37/88/97/50.9. The pH is normal.   eICU Interventions  Continue present management.      Intervention Category Major Interventions: Acid-Base disturbance - evaluation and management;Respiratory failure - evaluation and management  Lenell AntuSommer,Steven Eugene 04/18/2016, 4:44 PM

## 2016-04-19 ENCOUNTER — Inpatient Hospital Stay: Payer: Medicare Other

## 2016-04-19 DIAGNOSIS — J470 Bronchiectasis with acute lower respiratory infection: Secondary | ICD-10-CM

## 2016-04-19 LAB — CULTURE, BLOOD (ROUTINE X 2)
CULTURE: NO GROWTH
CULTURE: NO GROWTH

## 2016-04-19 MED ORDER — CLONAZEPAM 0.5 MG PO TABS
0.5000 mg | ORAL_TABLET | Freq: Every day | ORAL | Status: DC
  Administered 2016-04-19 – 2016-04-21 (×3): 0.5 mg via ORAL
  Filled 2016-04-19 (×3): qty 1

## 2016-04-19 MED ORDER — METHADONE HCL 10 MG/ML PO CONC
15.0000 mg | Freq: Every day | ORAL | Status: DC
  Filled 2016-04-19: qty 1.5

## 2016-04-19 MED ORDER — NAPHAZOLINE-GLYCERIN 0.012-0.2 % OP SOLN
1.0000 [drp] | Freq: Four times a day (QID) | OPHTHALMIC | Status: DC | PRN
  Filled 2016-04-19 (×3): qty 15

## 2016-04-19 MED ORDER — CLONAZEPAM 0.5 MG PO TABS
0.2500 mg | ORAL_TABLET | Freq: Every day | ORAL | Status: DC
  Filled 2016-04-19: qty 1

## 2016-04-19 MED ORDER — CLONAZEPAM 0.5 MG PO TABS
0.2500 mg | ORAL_TABLET | Freq: Every day | ORAL | Status: DC
  Administered 2016-04-20 – 2016-04-22 (×3): 0.25 mg via ORAL
  Filled 2016-04-19 (×3): qty 1

## 2016-04-19 NOTE — Progress Notes (Addendum)
Lethargic. No distress. Cognition intact. Still feels dyspneic and does not feel ready to go home  Vitals:   04/18/16 1700 04/18/16 2036 04/19/16 0020 04/19/16 0504  BP:  (!) 127/91  118/76  Pulse:  99  80  Resp:  18  18  Temp:  97.9 F (36.6 C)  97.4 F (36.3 C)  TempSrc:  Oral  Oral  SpO2: 95% 97% 96% 97%  Weight:      Height:       PHYSICAL EXAMINATION General: NAD Neuro: No focal deficits HEENT: WNL Cardiovascular: reg, no M Lungs: few scattered wheezes and diminished BS on L Abdomen: soft, NT, +BS Ext: no edema  BMP Latest Ref Rng & Units 04/15/2016 04/14/2016  Glucose 65 - 99 mg/dL 87 595(G104(H)  BUN 6 - 20 mg/dL 13 16  Creatinine 3.870.61 - 1.24 mg/dL 5.640.66 3.320.73  Sodium 951135 - 145 mmol/L 139 136  Potassium 3.5 - 5.1 mmol/L 4.5 4.6  Chloride 101 - 111 mmol/L 97(L) 97(L)  CO2 22 - 32 mmol/L 36(H) 35(H)  Calcium 8.9 - 10.3 mg/dL 9.2 9.3   CBC Latest Ref Rng & Units 04/15/2016 04/14/2016  WBC 3.8 - 10.6 K/uL 11.0(H) 9.8  Hemoglobin 13.0 - 18.0 g/dL 88.414.4 16.615.6  Hematocrit 06.340.0 - 52.0 % 41.9 45.1  Platelets 150 - 440 K/uL 227 252   CXR: volume loss and hazy opacification of L lung. Probably NSC  IMPRESSION: CVID Bronchiectasis with acute exacerbation Acute on chronic hypoxemic/hpercarbic respiratory failure due to exacerbation of bronchiectasis Anxiety, controlled Lethargy  PLAN: Cont outpatient Trilogy vent prn and qhs  Cont supplemental O2 to maintain O2 sats >92% Cont po prednisone @ 10 mg daily Cont nebulized steroids and bronchodilators Complete 10 days levofloxacin CT scan chest 11/18 ordered to better assess lung parenchyma Continue weekly SQ IgG Decrease clonazepam Decrease methadone Cont Dulcolax PRN for constipation Continue airway hygiene Cont PT  He does not feel ready for discharge to home today Billy Fischeravid Nurah Petrides, MD PCCM service Mobile [phone] Pager [phone] 04/19/2016

## 2016-04-19 NOTE — Progress Notes (Signed)
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  Per notes from [facility] pts lung disease was managed in [place] and he was evaluated twice for lung transplant at [facility], however he was denied due to tracheal pathology.     PT Comments    Pt declined this am but agreed to session at lunch.  Pt was able to ambulate in hallway with straight cane and min guard 230'.  He is unsteady at times and attributes it to LE weakness.  HR 130's at rest increased to 140's with gait.  Pt fatigued with gait but pleased to be able to ambulate.  Pt should have +1 assist when out of bed due to fall risk.  Follow Up Recommendations        Equipment Recommendations       Recommendations for Other Services       Precautions / Restrictions Precautions Precautions: Fall Restrictions Weight Bearing Restrictions: No    Mobility  Bed Mobility Overal bed mobility: Independent                Transfers Overall transfer level: Independent                  Ambulation/Gait Ambulation/Gait assistance: Min guard Ambulation Distance (Feet): 230 Feet Assistive device: Straight cane       General Gait Details: unsteady but no lob's   Stairs            Wheelchair Mobility     Modified Rankin (Stroke Patients Only)       Balance                                    Cognition Arousal/Alertness: Awake/alert Behavior During Therapy: WFL for tasks assessed/performed;Anxious Overall Cognitive Status: Within Functional Limits for tasks assessed                      Exercises      General Comments        Pertinent Vitals/Pain Pain Assessment: No/denies pain    Home Living                      Prior Function            PT Goals (current goals can now be found in the care plan section)      Frequency    Min 2X/week      PT Plan Current plan remains appropriate    Co-evaluation             End of Session Equipment Utilized During Treatment: Oxygen;Gait belt Activity Tolerance: Patient limited by fatigue Patient left: in bed;with call bell/phone within reach;with family/visitor present     Time: 4098-1191 PT Time Calculation (min) (ACUTE ONLY): 24 min  Charges:  $Gait Training: 23-37 mins                    G Codes:      Derrick DessSarah Maximillian Maynard 04/19/2016, 12:46 PM

## 2016-04-20 MED ORDER — PREDNISONE 5 MG PO TABS
5.0000 mg | ORAL_TABLET | Freq: Every day | ORAL | Status: DC
  Administered 2016-04-21 – 2016-04-22 (×2): 5 mg via ORAL
  Filled 2016-04-20 (×2): qty 1

## 2016-04-20 MED ORDER — PREDNISONE 5 MG PO TABS: 5.0000 mg | ORAL_TABLET | Freq: Every day | ORAL | Status: DC

## 2016-04-20 MED ORDER — METHADONE HCL 5 MG/5ML PO SOLN
2.5000 mg | Freq: Every day | ORAL | Status: DC
  Administered 2016-04-20: 16:00:00 2.5 mg
  Filled 2016-04-20 (×3): qty 3

## 2016-04-20 NOTE — Progress Notes (Signed)
Remains very lethargic. Worsening hypercarbia on ABG 11/17. No respiratory distresspresently . Cognition intact. Still does not feel ready to go home.   Vitals:   04/19/16 1507 04/19/16 2049 04/20/16 0008 04/20/16 0417  BP: (!) 128/95 122/82  117/78  Pulse: (!) 109 95  79  Resp:  (!) 22  17  Temp: 98.3 F (36.8 C) 98.2 F (36.8 C)  97.4 F (36.3 C)  TempSrc: Oral Oral  Oral  SpO2: 99% 100% 96% 96%  Weight:      Height:       PHYSICAL EXAMINATION General: NAD Slightly stridorous sound to respirations Neuro: No focal deficits HEENT: WNL Cardiovascular: reg, no M Lungs: few scattered wheezes and diminished BS on L Abdomen: soft, NT, +BS Ext: no edema  BMP Latest Ref Rng & Units 04/15/2016 04/14/2016  Glucose 65 - 99 mg/dL 87 562(Z104(H)  BUN 6 - 20 mg/dL 13 16  Creatinine 3.080.61 - 1.24 mg/dL 6.570.66 8.460.73  Sodium 962135 - 145 mmol/L 139 136  Potassium 3.5 - 5.1 mmol/L 4.5 4.6  Chloride 101 - 111 mmol/L 97(L) 97(L)  CO2 22 - 32 mmol/L 36(H) 35(H)  Calcium 8.9 - 10.3 mg/dL 9.2 9.3   CBC Latest Ref Rng & Units 04/15/2016 04/14/2016  WBC 3.8 - 10.6 K/uL 11.0(H) 9.8  Hemoglobin 13.0 - 18.0 g/dL 95.214.4 84.115.6  Hematocrit 32.440.0 - 52.0 % 41.9 45.1  Platelets 150 - 440 K/uL 227 252   CT chest 11/18: IMPRESSION: 1. Left greater than right and upper lobe predominant bronchiectasis with peribronchovascular nodularity and scattered ground-glass opacity. All likely related to infection, including atypical etiologies. More masslike opacity in the left upper lobe is also likely infectious, given the appearance of the remainder of the chest. Resolved volume loss in the upper left hemi thorax.  2. Abnormal appearance of the trachea and mediastinum, favored to be chronic. Lucencies along the trachea could partially be due to medial pleural based blebs. Small volume pneumomediastinum cannot be excluded. There is also probable air within the tracheal wall, of indeterminate acuity. 3. Trace loculated right-sided  pneumothorax, of indeterminate acuity. Left apical lucency could also represent minimal loculated pleural air or dominant left apical bleb. 4. Markedly abnormal appearance of the esophagus, with debris or fluid throughout. Correlate with dysmotility and consider nonemergent esophagram.   IMPRESSION: 1) CVID 2) Acute on chronic hypoxemic/hpercarbic respiratory failure due to exacerbation of bronchiectasis 3) Bronchiectasis with acute exacerbation. Possible PNA - no organism identified 4) Incidental finding of very small R sided PTX - appears chronic 5) Anxiety, controlled 6) Lethargy - it appears that his home dose of methadone is 1 mg/ml, 2 cc per tube daily. However, here he has been getting 10 mg/ml strength.  7) appearance of eshopageal dysmotility on CT chest  PLAN: Cont outpatient Trilogy vent prn and qhs  Cont supplemental O2 to maintain O2 sats >92% Decrease prednisone to 5 mg daily X 5 days, then DC Cont nebulized steroids and bronchodilators Complete 10 days levofloxacin Continue weekly SQ IgG Cont clonazepam Methadone dose adjusted as noted above Continue airway hygiene - will try chest percussion vest if available Cont PT Esophagram ordered to eval esophageal motility We might need to consider transfer [facility] (where his primary pulmonologist is Dr Renaldo ReelHuang)   Billy Fischeravid Laguana Desautel, MD PCCM service Mobile [phone] Pager [phone] 04/20/2016

## 2016-04-20 NOTE — Progress Notes (Signed)
Pt will place home trilogy Bipap himself when ready to sleep. 2L in line with Trilogy. Pt performing flutter himself. Pt doesn't want to perform CPT

## 2016-04-20 NOTE — Progress Notes (Addendum)
VSS, free of falls during shift.  Reported lateral chest, neck pain 3/10, denied need for intervention.  Reported dry eye, facial flushing, provided cool washcloths.  Reported klonopin dose HS 0.5mg , Dr. Anne HahnWillis paged, pt received ordered 0.5mg .  No biopatch [facility], reaccessed, dsg changed.  Bipap on while sleeping, tolerating well.  Bed in low position, call bell within reach.  WCTM.

## 2016-04-21 ENCOUNTER — Other Ambulatory Visit: Payer: Self-pay | Admitting: *Deleted

## 2016-04-21 ENCOUNTER — Inpatient Hospital Stay: Payer: Medicare Other

## 2016-04-21 DIAGNOSIS — J9601 Acute respiratory failure with hypoxia: Secondary | ICD-10-CM

## 2016-04-21 LAB — CREATININE, SERUM
CREATININE: 0.51 mg/dL — AB (ref 0.61–1.24)
GFR calc Af Amer: >60 mL/min (ref >60)
GFR calc non Af Amer: >60 mL/min (ref >60)

## 2016-04-21 MED ORDER — METHADONE HCL 5 MG/5ML PO SOLN
2.5000 mg | Freq: Every day | ORAL | Status: DC
  Administered 2016-04-21 – 2016-04-22 (×2): 2.5 mg via ORAL
  Filled 2016-04-21 (×2): qty 3

## 2016-04-21 MED ORDER — OXYCODONE HCL 5 MG PO TABS
5.0000 mg | ORAL_TABLET | Freq: Four times a day (QID) | ORAL | Status: DC | PRN
  Administered 2016-04-21 – 2016-04-22 (×3): 5 mg via ORAL
  Filled 2016-04-21 (×3): qty 1

## 2016-04-21 MED ORDER — NAPHAZOLINE-PHENIRAMINE 0.025-0.3 % OP SOLN: 2.0000 [drp] | Freq: Four times a day (QID) | OPHTHALMIC | Status: DC | PRN

## 2016-04-21 MED ORDER — POLYMYXIN B-TRIMETHOPRIM 10000-0.1 UNIT/ML-% OP SOLN
1.0000 [drp] | OPHTHALMIC | Status: DC
  Administered 2016-04-21 – 2016-04-22 (×7): 1 [drp] via OPHTHALMIC
  Filled 2016-04-21: qty 10

## 2016-04-21 MED ORDER — LEVOFLOXACIN IN D5W 750 MG/150ML IV SOLN
750.0000 mg | INTRAVENOUS | Status: DC
  Administered 2016-04-21: 17:00:00 750 mg via INTRAVENOUS
  Filled 2016-04-21 (×2): qty 150

## 2016-04-21 NOTE — Care Management Important Message (Signed)
Important Message  Patient Details  Name: Annye AsaMicah Shein MRN: [id number] Date of Birth: 09/18/1990   Medicare Important Message Given:  Yes    Gwenette GreetBrenda S Laith Antonelli, RN 04/21/2016, 11:07 AM

## 2016-04-21 NOTE — Progress Notes (Signed)
Spoke with Dr. Kristine GarbeSuddini at 7:30am. Hospitalist will resume patient's care from 11/21. PCCM will continue to see patient.

## 2016-04-21 NOTE — Progress Notes (Signed)
PT Cancellation Note  Patient Details Name: Annye AsaMicah Tien MRN: [id number] DOB: 10/10/1990   Cancelled Treatment:     Pt refused PT this date secondary to fatigue.  Pt stated "I only slept 2 hours last night" secondary to pain per pt.  Nursing notified.  Will attempt PT services in the future as appropriate.    Ovidio Hanger. Scott Jaquala Fuller PT, DPT 04/21/16, 11:58 AM

## 2016-04-21 NOTE — Progress Notes (Signed)
Remains very lethargic. Continued  hypercarbia on ABG 11/17. No respiratory distresspresently . Cognition intact. Still does not feel ready to go home.   Vitals:   04/20/16 2322 04/21/16 0207 04/21/16 0358 04/21/16 1100  BP:   124/87 117/71  Pulse:   89 86  Resp:   16 16  Temp:   98.2 F (36.8 C) 97.6 F (36.4 C)  TempSrc:   Oral Oral  SpO2: 98% 99% 100% 100%  Weight:      Height:       PHYSICAL EXAMINATION General: NAD Slightly continues to have stridorous sound to respirations Neuro: No focal deficits HEENT: WNL Cardiovascular: reg, no M Lungs: few scattered wheezes and diminished BS on L Abdomen: soft, NT, +BS Ext: no edema  BMP Latest Ref Rng & Units 04/21/2016 04/15/2016 04/14/2016  Glucose 65 - 99 mg/dL - 87 161(W104(H)  BUN 6 - 20 mg/dL - 13 16  Creatinine 9.600.61 - 1.24 mg/dL 4.54(U0.51(L) 9.810.66 1.910.73  Sodium 135 - 145 mmol/L - 139 136  Potassium 3.5 - 5.1 mmol/L - 4.5 4.6  Chloride 101 - 111 mmol/L - 97(L) 97(L)  CO2 22 - 32 mmol/L - 36(H) 35(H)  Calcium 8.9 - 10.3 mg/dL - 9.2 9.3   CBC Latest Ref Rng & Units 04/15/2016 04/14/2016  WBC 3.8 - 10.6 K/uL 11.0(H) 9.8  Hemoglobin 13.0 - 18.0 g/dL 47.814.4 29.515.6  Hematocrit 62.140.0 - 52.0 % 41.9 45.1  Platelets 150 - 440 K/uL 227 252   CT chest 11/18: IMPRESSION: 1. Left greater than right and upper lobe predominant bronchiectasis with peribronchovascular nodularity and scattered ground-glass opacity. All likely related to infection, including atypical etiologies. More masslike opacity in the left upper lobe is also likely infectious, given the appearance of the remainder of the chest. Resolved volume loss in the upper left hemi thorax.  2. Abnormal appearance of the trachea and mediastinum, favored to be chronic. Lucencies along the trachea could partially be due to medial pleural based blebs. Small volume pneumomediastinum cannot be excluded. There is also probable air within the tracheal wall, of indeterminate acuity. 3. Trace loculated  right-sided pneumothorax, of indeterminate acuity. Left apical lucency could also represent minimal loculated pleural air or dominant left apical bleb. 4. Markedly abnormal appearance of the esophagus, with debris or fluid throughout. Correlate with dysmotility and consider nonemergent esophagram.   IMPRESSION: 1) CVID 2) Acute on chronic hypoxemic/hpercarbic respiratory failure due to exacerbation of bronchiectasis 3) Bronchiectasis with acute exacerbation. Possible PNA - no organism identified 4) Incidental finding of very small R sided PTX - appears chronic 5) Anxiety, controlled 6) Lethargy - it appears that his home dose of methadone is 1 mg/ml, 2 cc per tube daily. However, here he has been getting 10 mg/ml strength.  7) appearance of eshopageal dysmotility on CT chest  PLAN: Cont outpatient Trilogy vent prn and qhs  Cont supplemental O2 to maintain O2 sats >92% Decrease prednisone to 5 mg daily X 5 days, then DC Cont nebulized steroids and bronchodilators Complete 10 days levofloxacin Continue weekly SQ IgG Cont clonazepam Methadone dose adjusted as noted above Continue airway hygiene - will try chest percussion vest if available Cont PT Esophagram ordered to eval esophageal motility We might need to consider transfer to [facility] (where his primary pulmonologist is Dr Renaldo ReelHuang)   Wells Guileseep Waylin Dorko, M.D.  Pager [phone] 04/21/2016

## 2016-04-21 NOTE — Progress Notes (Signed)
Pharmacy Antibiotic Note  Derrick Maynard is a 25 y.o. male admitted on 04/14/2016 with acute exacerbation of bronchiectasis/ pneumonia. Pharmacy has been consulted for levofloxacin dosing. Pt is respiratory colonizer of MSSA and pseudomonas.  This is day #7 of levofloxacin. Stop date entered for 11/22. Pulmonology following.  Plan: Continue levofloxacin 750 mg PO daily.  Height: 5\' 5"  (165.1 cm) Weight: 130 lb (59 kg) IBW/kg (Calculated) : 61.5  Temp (24hrs), Avg:98.3 F (36.8 C), Min:98.1 F (36.7 C), Max:98.6 F (37 C)   Recent Labs Lab 04/14/16 1842 04/14/16 1914 04/14/16 2316 04/15/16 0427  WBC 9.8  --   --  11.0*  CREATININE 0.73  --   --  0.66  LATICACIDVEN  --  0.7 0.8  --     Estimated Creatinine Clearance: 119.8 mL/min (by C-G formula based on SCr of 0.66 mg/dL).    No Known Allergies  Antimicrobials this admission: Vancomycin 11/13 >> 11/14 Cefepime 11/13 >> 11/15 Levofloxacin 11/14 >> 11/222  Microbiology results: 11/13 BCx: No growth final 11/13 UCx: Negative 11/13 Sputum: specimen not acceptable for testing  11/13 MRSA PCR: (-)  Thank you for allowing pharmacy to be a part of this patient's care.  Cindi CarbonMary M Tylene Quashie, PharmD, BCPS Clinical Pharmacist 04/21/2016 8:39 AM

## 2016-04-21 NOTE — Progress Notes (Signed)
CH paid a follow up visit. Pt was sitting up but presented tired and lethargic. Pt was using inhaler therapy. Pt stated that he had only gotten a couple of hours sleep. CH will follow up on morning rounds 04/22/16.    04/21/16 1500  Clinical Encounter Type  Visited With Patient  Visit Type Follow-up;Spiritual support  Referral From Nurse

## 2016-04-21 NOTE — Progress Notes (Signed)
Signed out by ICU. Magadelene.  Pneumonia, Bronchiectasis. [facility] today. We will follow from tomorrow if still here and no plans on transfer.

## 2016-04-22 ENCOUNTER — Telehealth: Payer: Self-pay

## 2016-04-22 LAB — CBC
HCT: 39 % — ABNORMAL LOW (ref 40.0–52.0)
Hemoglobin: 13.5 g/dL (ref 13.0–18.0)
MCH: 28.9 pg (ref 26.0–34.0)
MCHC: 34.7 g/dL (ref 32.0–36.0)
MCV: 83.4 fL (ref 80.0–100.0)
PLATELETS: 215 10*3/uL (ref 150–440)
RBC: 4.68 MIL/uL (ref 4.40–5.90)
RDW: 13.9 % (ref 11.5–14.5)
WBC: 7.4 10*3/uL (ref 3.8–10.6)

## 2016-04-22 LAB — CREATININE, SERUM
CREATININE: 0.62 mg/dL (ref 0.61–1.24)
GFR calc non Af Amer: >60 mL/min (ref >60)

## 2016-04-22 MED ORDER — OXYCODONE HCL 5 MG PO TABS: 5.0000 mg | ORAL_TABLET | Freq: Four times a day (QID) | ORAL | 0 refills | Status: AC | PRN

## 2016-04-22 MED ORDER — POLYMYXIN B-TRIMETHOPRIM 10000-0.1 UNIT/ML-% OP SOLN: 1.0000 [drp] | OPHTHALMIC | 0 refills | Status: DC

## 2016-04-22 MED ORDER — LEVOFLOXACIN 750 MG PO TABS: 750.0000 mg | ORAL_TABLET | Freq: Every day | ORAL | 0 refills | Status: AC

## 2016-04-22 MED ORDER — ENSURE ENLIVE PO LIQD: 1.0000 | Freq: Three times a day (TID) | ORAL | 1 refills | Status: AC

## 2016-04-22 MED ORDER — NAPHAZOLINE-GLYCERIN 0.012-0.2 % OP SOLN: 1.0000 [drp] | Freq: Four times a day (QID) | OPHTHALMIC | 0 refills | Status: AC | PRN

## 2016-04-22 MED ORDER — LEVOFLOXACIN 750 MG PO TABS: 750.0000 mg | ORAL_TABLET | Freq: Every day | ORAL | 0 refills | Status: DC

## 2016-04-22 MED ORDER — HEPARIN SOD (PORK) LOCK FLUSH 100 UNIT/ML IV SOLN
500.0000 [IU] | Freq: Once | INTRAVENOUS | Status: AC
  Administered 2016-04-22: 500 [IU] via INTRAVENOUS
  Filled 2016-04-22: qty 5

## 2016-04-22 MED ORDER — LORAZEPAM 1 MG PO TABS: 1.0000 mg | ORAL_TABLET | Freq: Four times a day (QID) | ORAL | 0 refills | Status: AC | PRN

## 2016-04-22 MED ORDER — POLYMYXIN B-TRIMETHOPRIM 10000-0.1 UNIT/ML-% OP SOLN: 1.0000 [drp] | OPHTHALMIC | 0 refills | Status: AC

## 2016-04-22 MED ORDER — LEVOFLOXACIN 750 MG PO TABS
750.0000 mg | ORAL_TABLET | Freq: Once | ORAL | Status: AC
  Administered 2016-04-22: 750 mg via ORAL
  Filled 2016-04-22: qty 1

## 2016-04-22 NOTE — Discharge Summary (Addendum)
[facility] [place] at [facility]   PATIENT NAME: Derrick Maynard    MR#:  [id number]  DATE OF BIRTH:  12-Mar-1991  DATE OF ADMISSION:  04/14/2016 ADMITTING PHYSICIAN: Merwyn Katos, MD  DATE OF DISCHARGE: 04/22/16 PRIMARY CARE PHYSICIAN: Dione Housekeeper, MD    ADMISSION DIAGNOSIS:  SOB (shortness of breath) [R06.02] Acute respiratory failure with hypoxia (HCC) [J96.01]  DISCHARGE DIAGNOSIS:  Active Problems:   Acute on chronic respiratory failure (HCC)   Common variable immunodeficiency (HCC) Bronchiectasis with acute exacerbation. Possible PNA - no organism identified  SECONDARY DIAGNOSIS:   Past Medical History:  Diagnosis Date  . Bronchiectasis (HCC)   . COPD (chronic obstructive pulmonary disease) (HCC)   . 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  Per notes from [facility] pts lung disease was managed in [place] and he was evaluated twice for lung transplant at [facility], however he was denied due to tracheal pathology.  His sister has identical lung disease and his father died of complications from genetic pulmonary disease in his 91's.  He has recently moved to [facility] 09/01/2015 and established care with [facility] Dr. Renaldo Reel who saw him on 02/07/2016.  He presented to [facility] ER on 11/13 with c/o  worsening shortness of breath, frequent productive cough, and generalized malaise over the last several days.  According to pts mother she contacted his pulmonologist on 11/3 due to respiratory symptoms and he was subsequently prescribed a 12 day prednisone taper (completion will be 11/14) and instructed to take bactrim for 14 days.  His mother stated due to respiratory symptoms she increased his O2 from 2L to 4L and noticed improvement of symptoms on 11/12.  However, 11/13 she noticed the pt was in respiratory distress she initially thought the pt had a buildup of CO2 due to increased oxygen at 4L via nasal canula and placed him on continunous Bipap. She also administered 4 breathing treatments at home without relief of symptoms prompting visit to the ER on 11/13.  PCCM contacted to admit pt due to acute on chronic hypercapnic hypoxic respiratory failure secondary to Acute Exacerbation of Bronchiectasis vs. ?HCAP. Please review history and physical for details by critical-care  Hospital course  ) CVID Continue weekly SQ IgG Cont clonazepam Methadone dose adjusted as noted above and resume home dose at discharge Outpatient follow-up with infectious disease Dr. Sampson Goon in 2 weeks is recommended  2) Acute on chronic hypoxemic/hpercarbic respiratory failure due to exacerbation of bronchiectasis-- better clinically patient is at his baseline Pulmonology has recommended to discontinue his steroid as he completed the course Will provide him levofloxacin for 5 more days at the time of discharge today Outpatient follow-up with pulmonology Dr. Darrol Angel in 2 weeks Cont outpatient Trilogy vent prn and qhs  Cont supplemental O2 to maintain O2 sats >92% Continue outpatient neb less steroids and rhonchi bilaterally  3) Bronchiectasis with acute exacerbation.  Possible PNA - no organism identified Continue airway hygiene - will try chest percussion vest if available Cont PT Esophagram ordered to eval  esophageal motility [facility]-- primary pulmonologist is Dr Ileene HutchinsonHuang-outpatient follow-up with him as recommended. Awake  4) Incidental finding of very small R sided PTX - appears chronic  5) Anxiety, controlled Continue clonazepam  6) Lethargy - it appears that his home dose of methadone is 1 mg/ml, 2 cc per tube daily. However, here he has been getting 10 mg/ml strength.    7) appearance of eshopageal dysmotility on CT chest oesophagogram  revealed no dysmotility of the esophagus      DISCHARGE CONDITIONS:   At his baseline  CONSULTS OBTAINED:  Treatment Team:  Mick Sellavid P Fitzgerald, MD pulmonology  PROCEDURES none  DRUG ALLERGIES:  No Known Allergies  DISCHARGE MEDICATIONS:   Current Discharge Medication List    START taking these medications   Details  feeding supplement, ENSURE ENLIVE, (ENSURE ENLIVE) LIQD Take 237 mLs by mouth 3 (three) times daily between meals. Qty: 90 Bottle, Refills: 1    levofloxacin (LEVAQUIN) 750 MG tablet Take 1 tablet (750 mg total) by mouth daily. Qty: 5 tablet, Refills: 0    naphazoline-glycerin (CLEAR EYES) 0.012-0.2 % SOLN Place 1-2 drops into both eyes 4 (four) times daily as needed for irritation. Refills: 0    trimethoprim-polymyxin b (POLYTRIM) ophthalmic solution Place 1 drop into the right eye every 4 (four) hours. Qty: 10 mL, Refills: 0      CONTINUE these medications which have CHANGED   Details  LORazepam (ATIVAN) 1 MG tablet Take 1 tablet (1 mg total) by mouth 4 (four) times daily as needed. Qty: 15 tablet, Refills: 0    oxyCODONE (OXY IR/ROXICODONE) 5 MG immediate release tablet Take 1 tablet (5 mg total) by mouth every 6 (six) hours as needed. Qty: 15 tablet, Refills: 0      CONTINUE these medications which have NOT CHANGED   Details  albuterol (PROVENTIL) (5 MG/ML) 0.5% nebulizer solution Inhale 0.5 mLs into the lungs every 4 (four) hours as needed.    calcium carbonate (TUMS) 500 MG chewable tablet 2 tablets by  Feeding Tube route daily.    Cholecalciferol (VITAMIN D3) 2000 units capsule Take 8,000 Units by mouth daily.    clonazePAM (KLONOPIN) 0.5 MG tablet Take 0.25-0.5 mg by mouth 2 (two) times daily. 0.25mg  Q AM 0.5mg  Q HS    dornase alpha (PULMOZYME) 1 MG/ML nebulizer solution Inhale 2.5 mLs into the lungs 2 (two) times daily.    ipratropium-albuterol (DUONEB) 0.5-2.5 (3) MG/3ML SOLN Take 3 mLs by nebulization 3 (three) times daily as needed.    methadone (DOLOPHINE) 1 MG/1ML solution Take 2 mLs by mouth daily.    mirtazapine (REMERON) 15 MG tablet Take 1 tablet by mouth daily.    omeprazole (PRILOSEC) 20 MG capsule Take 1 capsule by mouth daily.    venlafaxine XR (EFFEXOR-XR) 37.5 MG 24 hr capsule Take 2 capsules by mouth daily.      STOP taking these medications     predniSONE (DELTASONE) 10 MG tablet          DISCHARGE INSTRUCTIONS:   [facility]-- primary pulmonologist is Dr Ileene HutchinsonHuang-outpatient follow-up with him in a week patient is to make an appointment Continue oxygen nasal cannula 2-3 L Follow-up with Dr. Sampson GoonFitzgerald on infectious diseases in 2 weeks Follow-up with primary care physician in a week Follow-up with Dr. Darrol AngelSimons pulmonology in 2 weeks  DIET:   Regulardiet with feeding  supplements 3 times a day with meals DISCHARGE CONDITION:  Fair,At his baseline  ACTIVITY:  Activity as tolerated,Patient is bedbound for the last few weeks according to his mom  OXYGEN:  Home Oxygen: Yes.     Oxygen Delivery: 2-3 liters/min via Patient connected to nasal cannula oxygen  DISCHARGE LOCATION:  home   If you experience worsening of your admission symptoms, develop shortness of breath, life threatening emergency, suicidal or homicidal thoughts you must seek medical attention immediately by calling 911 or calling your MD immediately  if symptoms less severe.  You Must read complete instructions/literature along with all the possible adverse reactions/side effects for all the  Medicines you take and that have been prescribed to you. Take any new Medicines after you have completely understood and accpet all the possible adverse reactions/side effects.   Please note  You were cared for by a hospitalist during your hospital stay. If you have any questions about your discharge medications or the care you received while you were in the hospital after you are discharged, you can call the unit and asked to speak with the hospitalist on call if the hospitalist that took care of you is not available. Once you are discharged, your primary care physician will handle any further medical issues. Please note that NO REFILLS for any discharge medications will be authorized once you are discharged, as it is imperative that you return to your primary care physician (or establish a relationship with a primary care physician if you do not have one) for your aftercare needs so that they can reassess your need for medications and monitor your lab values.     Today  Chief Complaint  Patient presents with  . Cough  . Shortness of Breath   Patient is a wheezing but he reports that is his baseline, discussed with mom over the phone she agrees with that. He  usually doesn't ambulate much  ROS:  CONSTITUTIONAL: Denies fevers, chills. Denies any fatigue, weakness.  EYES: Denies blurry vision, double vision, eye pain. EARS, NOSE, THROAT: Denies tinnitus, ear pain, hearing loss. RESPIRATORY: Denies cough, reports chronic  wheeze,  denies shortness of breath. while resting   CARDIOVASCULAR: Denies chest pain, palpitations, edema.  GASTROINTESTINAL: Denies nausea, vomiting, diarrhea, abdominal pain. Denies bright red blood per rectum. GENITOURINARY: Denies dysuria, hematuria. ENDOCRINE: Denies nocturia or thyroid problems. HEMATOLOGIC AND LYMPHATIC: Denies easy bruising or bleeding. SKIN: Denies rash or lesion. MUSCULOSKELETAL: Denies pain in neck, back, shoulder, knees, hips or arthritic  symptoms.  NEUROLOGIC: Denies paralysis, paresthesias.  PSYCHIATRIC: Denies anxiety or depressive symptoms.   VITAL SIGNS:  Blood pressure 129/88, pulse (!) 104, temperature 97.6 F (36.4 C), temperature source Oral, resp. rate 20, height 5\' 5"  (1.651 m), weight 59 kg (130 lb), SpO2 99 %.  I/O:    Intake/Output Summary (Last 24 hours) at 04/22/16 1420 Last data filed at 04/22/16 0309  Gross per 24 hour  Intake              150 ml  Output                1 ml  Net              149 ml    PHYSICAL EXAMINATION:  GENERAL:  25 y.o.-year-old patient lying in the bed with no acute distress.  EYES: Pupils equal, round, reactive to light and accommodation. No scleral icterus. Extraocular muscles intact.  HEENT: Head atraumatic, normocephalic. Oropharynx and nasopharynx clear.  NECK:  Supple, no jugular venous distention. No thyroid enlargement, no tenderness.  LUNGS: Moderate breath sounds bilaterally, min diffuse wheezing, no rales,rhonchi or crepitation. No use of accessory muscles of respiration.  CARDIOVASCULAR: S1, S2 normal. No murmurs, rubs, or gallops.  ABDOMEN: Soft, non-tender, non-distended. Bowel sounds present. No organomegaly or mass.  EXTREMITIES: No pedal edema, cyanosis, or clubbing.  NEUROLOGIC: Cranial nerves II through XII are intact. Muscle strength 5/5 in all extremities. Sensation intact. Gait not checked.  PSYCHIATRIC: The patient is alert and oriented x 3.  SKIN: No obvious rash, lesion, or ulcer.   DATA REVIEW:   CBC  Recent Labs Lab 04/22/16 0409  WBC 7.4  HGB 13.5  HCT 39.0*  PLT 215    Chemistries   Recent Labs Lab 04/22/16 0409  CREATININE 0.62    Cardiac Enzymes No results for input(s): TROPONINI in the last 168 hours.  Microbiology Results  Results for orders placed or performed during the hospital encounter of 04/14/16  Blood culture (routine x 2)     Status: None   Collection Time: 04/14/16  7:25 PM  Result Value Ref Range Status    Specimen Description BLOOD PORTA CATH  Final   Special Requests BOTTLES DRAWN AEROBIC AND ANAEROBIC 8CC  Final   Culture NO GROWTH 5 DAYS  Final   Report Status 04/19/2016 FINAL  Final  Blood culture (routine x 2)     Status: None   Collection Time: 04/14/16  7:55 PM  Result Value Ref Range Status   Specimen Description BLOOD PORTA CATH  Final   Special Requests BOTTLES DRAWN AEROBIC AND ANAEROBIC 8CC  Final   Culture NO GROWTH 5 DAYS  Final   Report Status 04/19/2016 FINAL  Final  MRSA PCR Screening     Status: None   Collection Time: 04/14/16 10:20 PM  Result Value Ref Range Status   MRSA by PCR NEGATIVE NEGATIVE Final    Comment:        The GeneXpert MRSA Assay (FDA approved for NASAL specimens only), is one component of a comprehensive MRSA colonization surveillance program. It is not intended to diagnose MRSA infection nor to guide or monitor treatment for MRSA infections.   Culture, Urine     Status: None   Collection Time: 04/14/16 11:05 PM  Result Value Ref Range Status   Specimen Description URINE, RANDOM  Final   Special Requests NONE  Final   Culture NO GROWTH Performed at [facility]   Final   Report Status 04/16/2016 FINAL  Final  Culture, expectorated sputum-assessment     Status: None   Collection Time: 04/15/16  4:22 PM  Result Value Ref Range Status   Specimen Description SPUTUM  Final   Special Requests NONE  Final   Sputum evaluation   Final    Sputum specimen not acceptable for testing.  Please recollect.   RESULT CALLED TO, READ BACK BY AND VERIFIED WITH: KELSEY WATTS 04/15/16 @ 1840  MLK    Report Status 04/15/2016 FINAL  Final    RADIOLOGY:  Ct Chest Wo Contrast  Result Date: 04/19/2016 CLINICAL DATA:  Abnormal chest radiograph. Increased cough and shortness of breath. Common variable immunodeficiency. Bronchiectasis. COPD. Acute on chronic respiratory failure. Nonsmoker. EXAM: CT CHEST WITHOUT CONTRAST TECHNIQUE: Multidetector CT  imaging of the chest was performed following the standard protocol without IV contrast. COMPARISON:  Plain films back to 04/14/2016.  No prior CT. FINDINGS: Cardiovascular: Normal heart size, without pericardial effusion. Right Port-A-Cath  which terminates at the mid SVC. Mediastinum/Nodes: No mediastinal or definite hilar adenopathy, given limitations of unenhanced CT. Debris or gastric contents throughout the esophagus, including superiorly on image 32/series 2. Lungs/Pleura: Left-sided pleural thickening without significant pleural fluid. Trace right-sided pleural air is of indeterminate acuity, including laterally adjacent the right upper lobe on image 39/series 3. Likely loculated secondary to pleural-based adhesions. Lucency at the left apex could also represent small volume loculated pleural air (example coronal image 63). Or be related to a dominant bleb. irregularity of the bronchial tree, greater on the left than right. Extensive paraseptal emphysema. Trace apparent air within the tracheal wall including on image 39/series 3. Suspicion of small volume pneumomediastinum, difficult to differentiate from presumed adjacent upper lobe bulla and blebs. Moderate volume loss in the left upper lobe. Left greater than right areas of peribronchovascular nodularity. Bronchiectasis which is most significant in the left upper lobe, but identified throughout both lungs. Patchy ground-glass opacities in a peribronchovascular distribution of the right lower lobe. More masslike opacity in the left apex including at 5.2 x 3.1 cm on image 49/series 3. Upper Abdomen: Normal imaged portions of the liver, spleen, stomach, pancreas, adrenal glands. Bilateral left larger than right renal collecting system calculi. Musculoskeletal: No acute osseous abnormality. IMPRESSION: 1. Left greater than right and upper lobe predominant bronchiectasis with peribronchovascular nodularity and scattered ground-glass opacity. All likely related  to infection, including atypical etiologies. More masslike opacity in the left upper lobe is also likely infectious, given the appearance of the remainder of the chest. Resolved volume loss in the upper left hemi thorax. 2. Abnormal appearance of the trachea and mediastinum, favored to be chronic. Lucencies along the trachea could partially be due to medial pleural based blebs. Small volume pneumomediastinum cannot be excluded. There is also probable air within the tracheal wall, of indeterminate acuity. 3. Trace loculated right-sided pneumothorax, of indeterminate acuity. Left apical lucency could also represent minimal loculated pleural air or dominant left apical bleb. 4. Markedly abnormal appearance of the esophagus, with debris or fluid throughout. Correlate with dysmotility and consider nonemergent esophagram. These results will be called to the ordering clinician or representative by the Radiologist Assistant, and communication documented in the PACS or zVision Dashboard. Electronically Signed   By: Jeronimo GreavesKyle  Talbot M.D.   On: 04/19/2016 13:47   Dg Esophagus  Result Date: 04/21/2016 CLINICAL DATA:  Evaluate for aspiration. History of bronchiectasis residual food in the esophagus. EXAM: ESOPHOGRAM/BARIUM SWALLOW TECHNIQUE: Single contrast examination was performed using thin barium or water soluble. A barium tablet was administered as well FLUOROSCOPY TIME:  Fluoroscopy Time:  0 minutes, 30 seconds Radiation Exposure Index (if provided by the fluoroscopic device): 413.37 micro Gy per meters squared Number of Acquired Spot Images: 2+ 3 video loops COMPARISON:  CT scan of the chest of April 19, 2016 FINDINGS: The patient ingested thin barium through a straw all in the semi recumbent position. The initiation of the voluntary portion of the swallowing maneuver was normal. No aspiration was observed. The thoracic esophagus distended well. No retained food was noted within the esophagus. Esophageal motility  appeared normal. The barium tablet passed promptly from the mouth to the stomach. IMPRESSION: No evidence of aspiration. Deviation of the trachea toward the left at the thoracic inlet likely due to the dilated mid trachea demonstrated on the CT scan. No retained food particles within the esophagus. No stricture or abnormal dilation of the esophagus. Normal peristalsis. The barium tablet passed promptly from the mouth to  the stomach. Electronically Signed   By: David  [place] M.D.   On: 04/21/2016 10:05   Dg Chest Port 1 View  Result Date: 04/19/2016 CLINICAL DATA:  Acute respiratory failure. Cough, shortness of breath. EXAM: PORTABLE CHEST 1 VIEW COMPARISON:  04/15/2016 FINDINGS: Right Port-A-Cath is again noted which a loops at the base of the neck. The tip is in the SVC. No change. Chronic pleural thickening on the left with patchy airspace opacities and volume loss. No change in the appearance since prior study. Right lung is clear. IMPRESSION: No significant change since prior study. Electronically Signed   By: Charlett Nose M.D.   On: 04/19/2016 08:50    EKG:   Orders placed or performed during the hospital encounter of 04/14/16  . ED EKG within 10 minutes  . ED EKG within 10 minutes  . EKG 12-Lead  . EKG 12-Lead      Management plans discussed with the patient, family and they are in agreement.  CODE STATUS:     Code Status Orders        Start     Ordered   04/14/16 2057  Full code  Continuous     04/14/16 2058    Code Status History    Date Active Date Inactive Code Status Order ID Comments User Context   This patient has a current code status but no historical code status.      TOTAL TIME TAKING CARE OF THIS PATIENT: 46  minutes.   Note: This dictation was prepared with Dragon dictation along with smaller phrase technology. Any transcriptional errors that result from this process are unintentional.   @M[facility] @  on 04/22/2016 at 2:20 PM  Between 7am to 6pm - Pager -  [phone]  After 6pm go to [email or web address] - password [email or web address] [facility]  [facility]  Office  [phone]  CC: Primary care physician; Dione Housekeeper, MD Infectious disease Dr. Lilly Cove-- primary pulmonologist is Dr Renaldo Reel-

## 2016-04-22 NOTE — Telephone Encounter (Signed)
Called patient,spoke with his mother they are aware of appointment on 05/16/16 and to come 45 min early to have CXR

## 2016-04-22 NOTE — Telephone Encounter (Signed)
-----   Message from CarmineMisty R Ahmad, [place] sent at 09/81/191411/20/2017 12:10 PM EST ----- Please schedule hosp f/u as directed below and inform pt to get CXR prior to appt.  Thanks, Misty ----- Message ----- From: Merwyn Katosavid B Simonds, MD Sent: 04/18/2016   1:54 PM To: Renea EeMisty R Ahmad, LPN  Post hospital follow up with me in the office in 3-4 weeks with CXR same day  Thanks  Theodoro Gristave

## 2016-04-22 NOTE — Discharge Instructions (Signed)
[facility]-- primary pulmonologist is Dr Ileene HutchinsonHuang-outpatient follow-up with him in a week patient is to make an appointment Continue oxygen nasal cannula 2-3 L Follow-up with Dr. Sampson GoonFitzgerald on infectious diseases in 2 weeks Follow-up with primary care physician in a week Follow-up with Dr. Darrol AngelSimons pulmonology in 2 weeks

## 2016-04-22 NOTE — Progress Notes (Signed)
Per Dr Juliene PinaMody ok to discharge patient he does not need PT

## 2016-04-22 NOTE — Progress Notes (Signed)
Ambulated patient around unit on 2 liters o2, sats around 96-98 but HR was in the high 130's to low 140's MD aware

## 2016-04-22 NOTE — Progress Notes (Signed)
very lethargic initially, he arouses after a few minutes of speaking, but still mildly lethargic. No respiratory distresspresently . Cognition intact. Pt very much wants to go home.   Vitals:   04/22/16 0434 04/22/16 0435 04/22/16 0728 04/22/16 0938  BP: (!) 140/96 133/86  131/83  Pulse: 81 74  84  Resp: 17   18  Temp: 97.4 F (36.3 C)   98.2 F (36.8 C)  TempSrc: Oral   Oral  SpO2: 100%  99% 100%  Weight:      Height:       PHYSICAL EXAMINATION General: NAD, lethargic, dyspneic.  Slightly continues to have stridorous sound to respirations Neuro: No focal deficits HEENT: WNL Cardiovascular: reg, no M Lungs: few scattered wheezes and diminished BS on L Abdomen: soft, NT, +BS Ext: no edema  BMP Latest Ref Rng & Units 04/22/2016 04/21/2016 04/15/2016  Glucose 65 - 99 mg/dL - - 87  BUN 6 - 20 mg/dL - - 13  Creatinine 1.610.61 - 1.24 mg/dL 0.960.62 0.45(W0.51(L) 0.980.66  Sodium 135 - 145 mmol/L - - 139  Potassium 3.5 - 5.1 mmol/L - - 4.5  Chloride 101 - 111 mmol/L - - 97(L)  CO2 22 - 32 mmol/L - - 36(H)  Calcium 8.9 - 10.3 mg/dL - - 9.2   CBC Latest Ref Rng & Units 04/22/2016 04/15/2016 04/14/2016  WBC 3.8 - 10.6 K/uL 7.4 11.0(H) 9.8  Hemoglobin 13.0 - 18.0 g/dL 11.913.5 14.714.4 82.915.6  Hematocrit 40.0 - 52.0 % 39.0(L) 41.9 45.1  Platelets 150 - 440 K/uL 215 227 252     IMPRESSION: 1) CVID 2) Acute on chronic hypoxemic/hpercarbic respiratory failure due to exacerbation of bronchiectasis-- better.  3) Bronchiectasis with acute exacerbation. Possible PNA - no organism identified 4) Incidental finding of very small R sided PTX - appears chronic 5) Anxiety, controlled 6) Lethargy - it appears that his home dose of methadone is 1 mg/ml, 2 cc per tube daily. However, here he has been getting 10 mg/ml strength.  7) appearance of eshopageal dysmotility on CT chest  PLAN: Cont outpatient Trilogy vent prn and qhs  Cont supplemental O2 to maintain O2 sats >92% Decrease prednisone to 5 mg daily X 5  days, then DC Cont nebulized steroids and bronchodilators Complete 10 days levofloxacin Continue weekly SQ IgG Cont clonazepam Methadone dose adjusted as noted above Continue airway hygiene - will try chest percussion vest if available Cont PT Esophagram ordered to eval esophageal motility [facility]-- primary pulmonologist is Dr Renaldo ReelHuang  --Pt can likely be discharged tomorrow from respiratory standpoint. He is very insistent that he wants to go home today, have asked RN to ambulate pt to see his exercise tolerance.    Wells Guileseep Lisha Vitale, M.D.  Pager [phone] 04/22/2016

## 2016-04-22 NOTE — Progress Notes (Signed)
Telemetry called HR sustained in the 130's patient was up to the bathroom.  MD aware.

## 2016-04-22 NOTE — Progress Notes (Signed)
Received MD order to discharge patient to home, reviewed home meds prescriptions and discharge instructions with patient and mom and both verbalized understanding, discharged in wheelchair with patients home oxygen to home

## 2016-05-16 ENCOUNTER — Inpatient Hospital Stay: Payer: Medicare Other | Admitting: Pulmonary Disease

## 2016-05-16 ENCOUNTER — Encounter: Payer: Self-pay | Admitting: *Deleted

## 2016-07-03 DEATH — deceased

## 2018-08-31 IMAGING — DX DG CHEST 1V PORT
1 series · 1 of 1 positions shown · non-contrast
Comparison: 04/14/2016

CLINICAL DATA: Respiratory failure.

EXAM:
PORTABLE CHEST 1 VIEW

[chest ap]
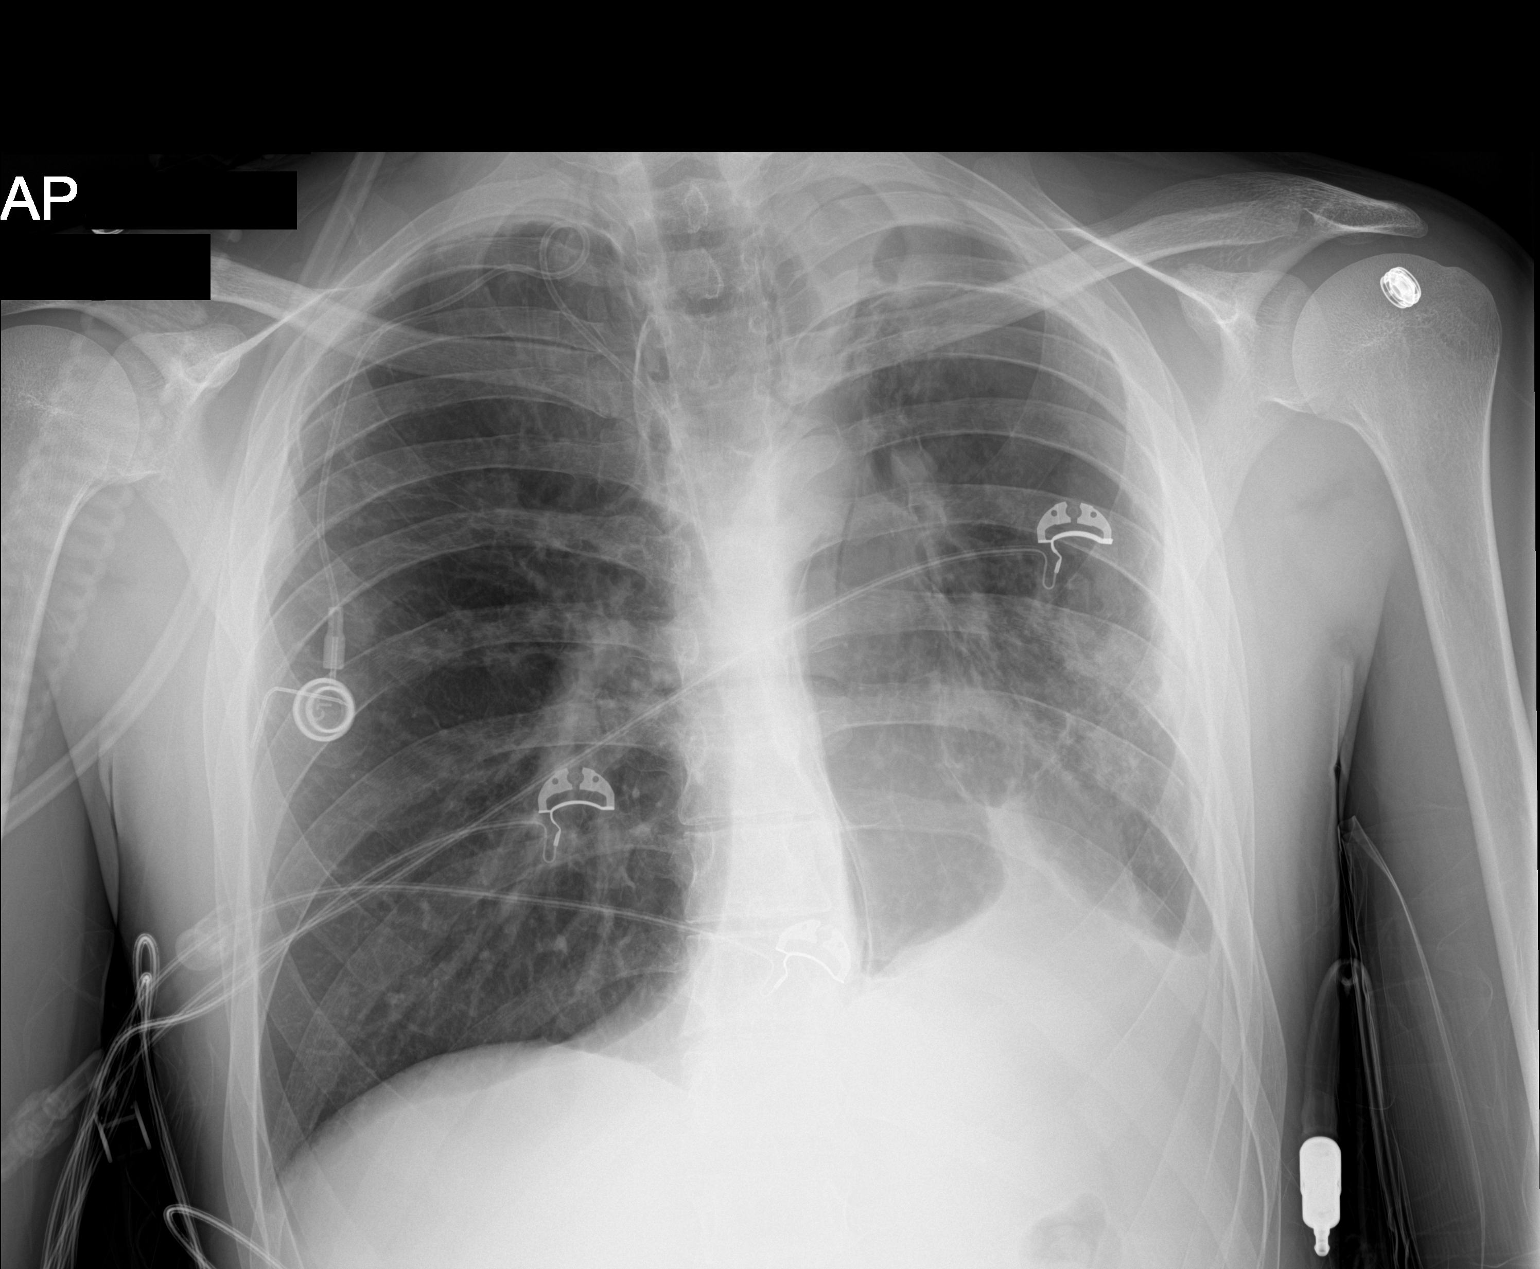

[1 of 1 positions shown; findings below may reference images not displayed]

FINDINGS: Redemonstrated is single lumen Port-A-Cath, looped in the distal IJ,
stable. Normal heart size, with leftward shift due to chronic LEFT
lobe volume loss. Chronic pleural thickening is stable.
Bronchiectatic changes without definite superimposed active
infiltrates. Improved aeration in the RIGHT upper lobe, possible
clearing of subsegmental atelectasis.
IMPRESSION: Improved aeration.  No active infiltrates or failure.

## 2018-09-04 IMAGING — DX DG CHEST 1V PORT
1 series · 1 of 1 positions shown · non-contrast
Comparison: 04/15/2016

CLINICAL DATA: Acute respiratory failure. Cough, shortness of
breath.

EXAM:
PORTABLE CHEST 1 VIEW

[chest ap]
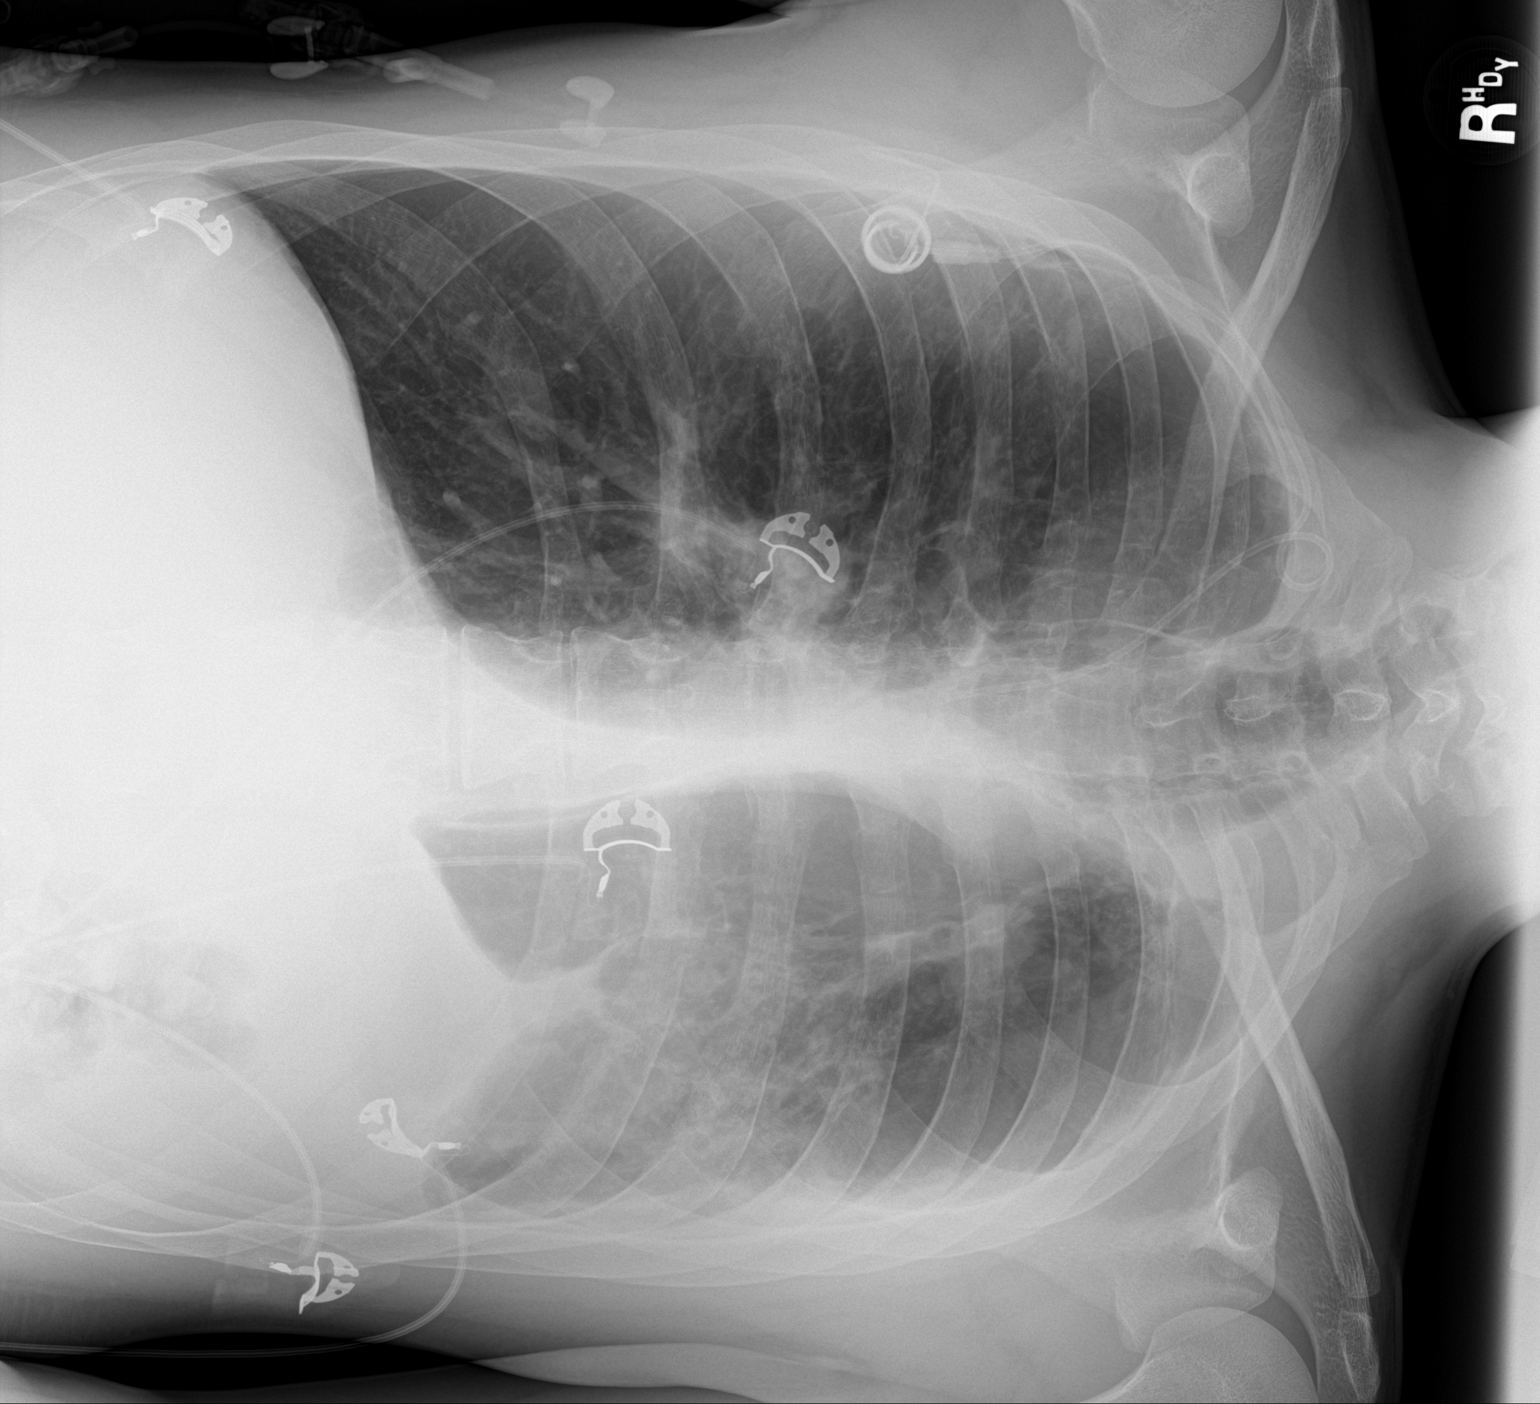

[1 of 1 positions shown; findings below may reference images not displayed]

FINDINGS: Right Port-A-Cath is again noted which a loops at the base of the
neck. The tip is in the SVC. No change. Chronic pleural thickening
on the left with patchy airspace opacities and volume loss. No
change in the appearance since prior study. Right lung is clear.
IMPRESSION: No significant change since prior study.

## 2018-09-04 IMAGING — CT CT CHEST W/O CM
2 of 3 series · 15 of 36 positions shown, 18 images · non-contrast
Comparison: Plain films back to 04/14/2016.  No prior CT.

CLINICAL DATA: Abnormal chest radiograph. Increased cough and
shortness of breath. Common variable immunodeficiency.
Bronchiectasis. COPD. Acute on chronic respiratory failure.
Nonsmoker.

EXAM:
CT CHEST WITHOUT CONTRAST
TECHNIQUE: Multidetector CT imaging of the chest was performed following the
standard protocol without IV contrast.

[Series 2: thorax · axial · 0.63mm/px · z∈[-583,-331]mm · 12 of 150 slices shown, 15 images]
[im 12/150  mediastinal]
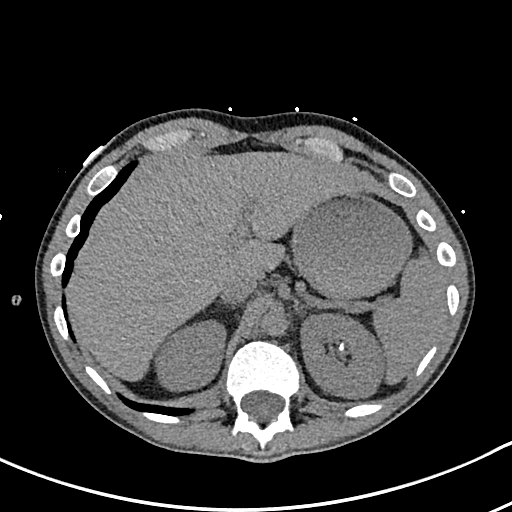
[im 12/150  lung]
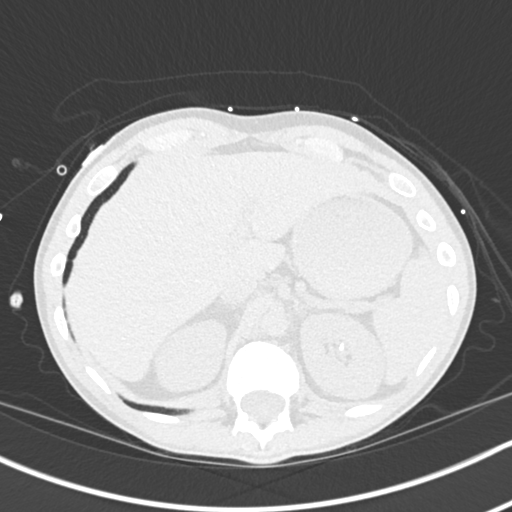
[im 23/150  lung]
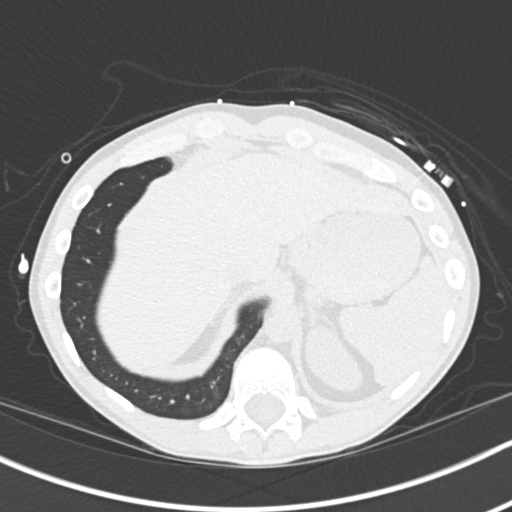
[im 34/150  lung]
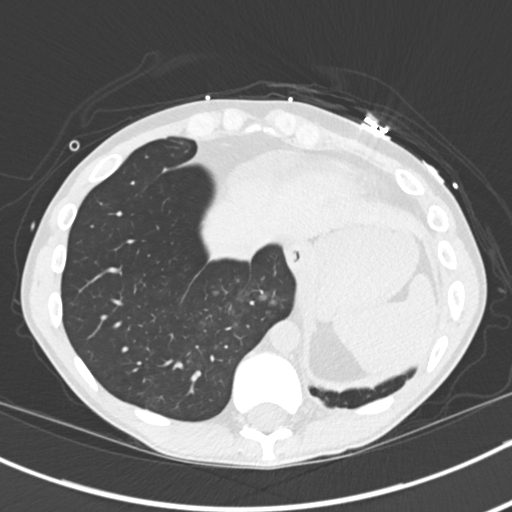
[im 45/150  lung]
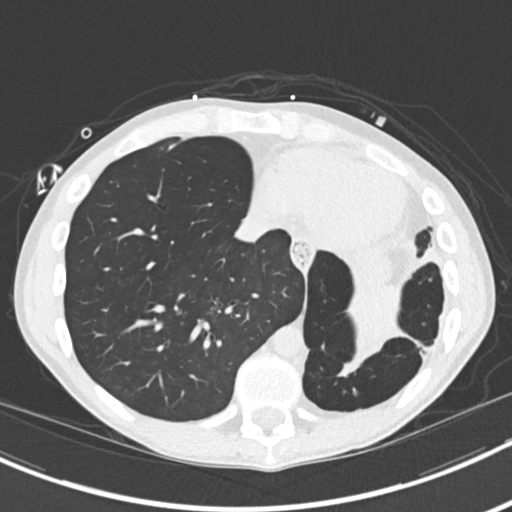
[im 56/150  mediastinal]
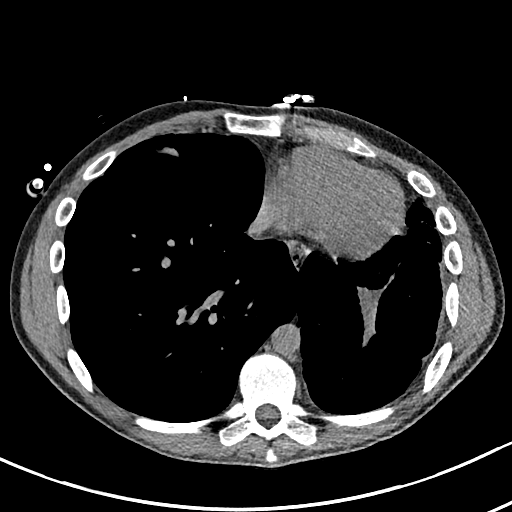
[im 56/150  lung]
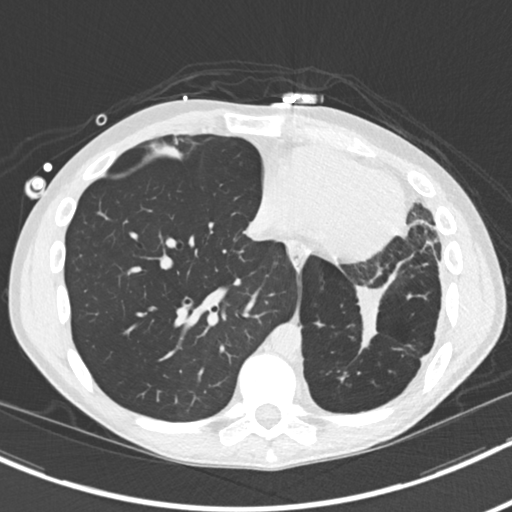
[im 67/150  lung]
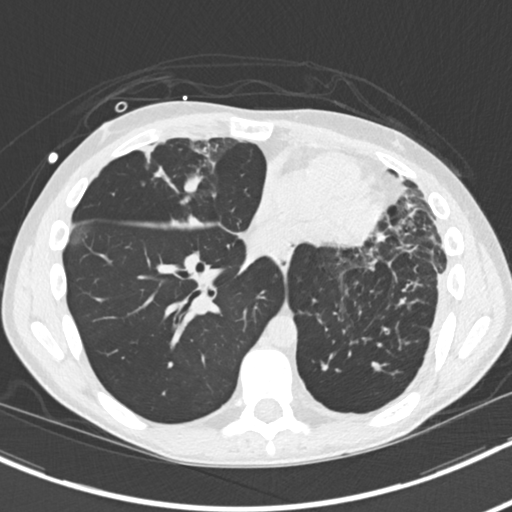
[im 83/150  lung]
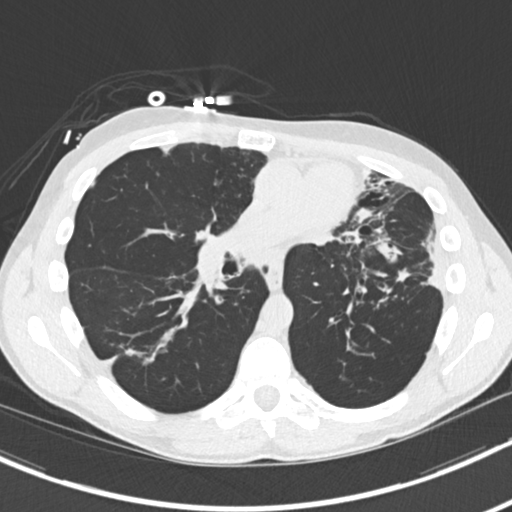
[im 94/150  lung]
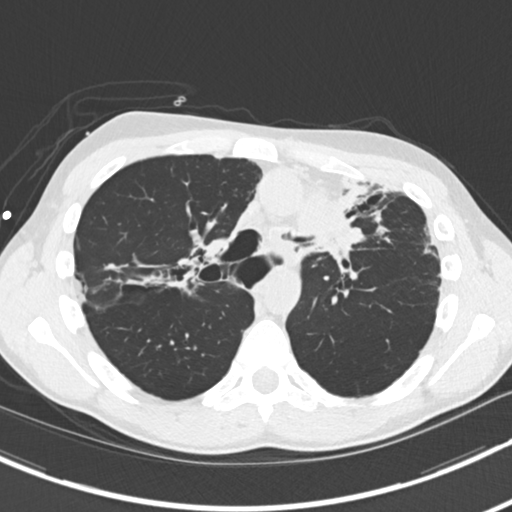
[im 105/150  mediastinal]
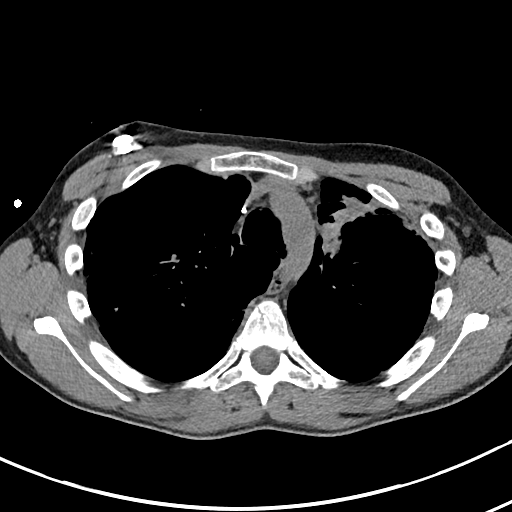
[im 105/150  lung]
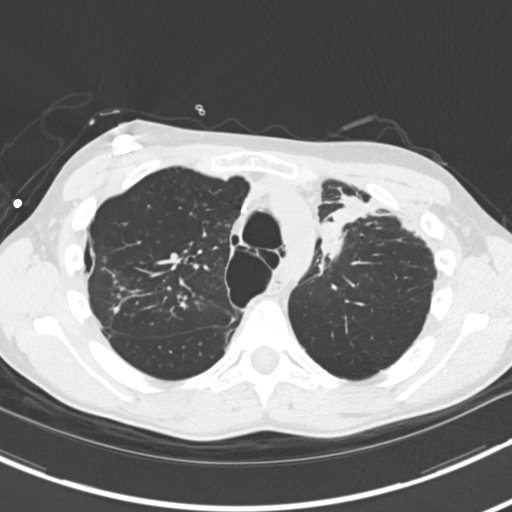
[im 116/150  lung]
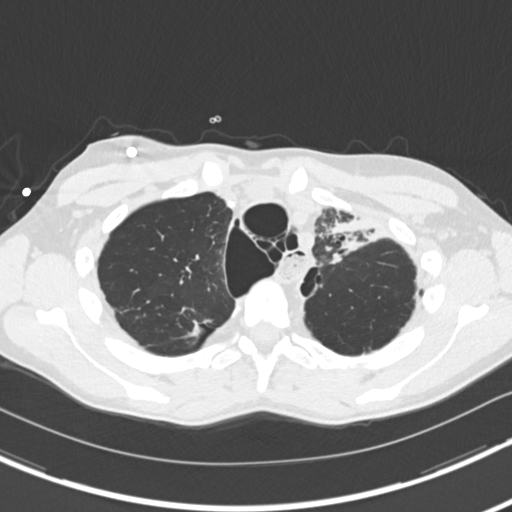
[im 127/150  lung]
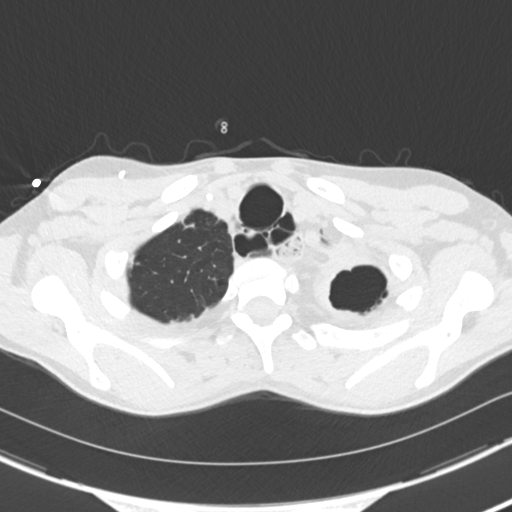
[im 138/150  lung]
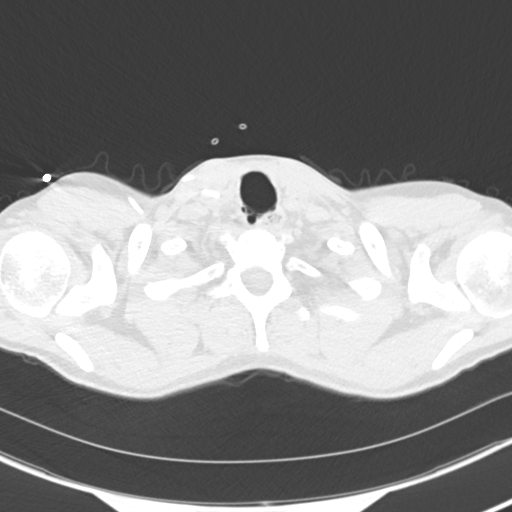

[Series 5: coronal · coronal · 0.60mm/px · 3 of 117 slices shown]
[im 24/117  lung]
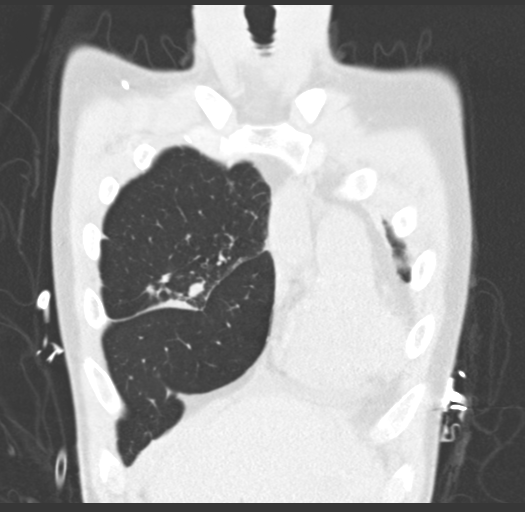
[im 47/117  lung]
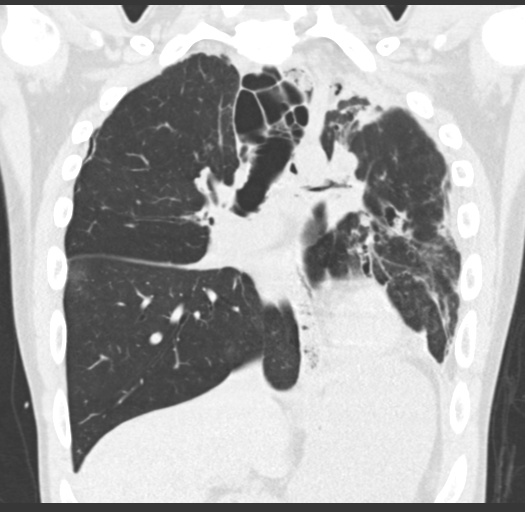
[im 70/117  lung]
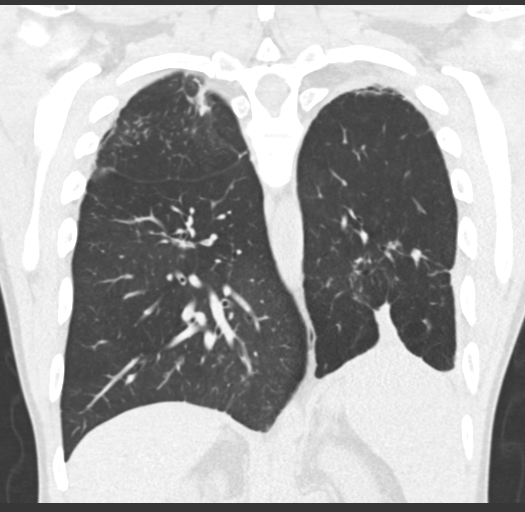

[15 of 36 positions shown; findings below may reference images not displayed]

FINDINGS: Cardiovascular: Normal heart size, without pericardial effusion.
Right Port-A-Cath which terminates at the mid SVC.

Mediastinum/Nodes: No mediastinal or definite hilar adenopathy,
given limitations of unenhanced CT.

Debris or gastric contents throughout the esophagus, including
superiorly on image 32/series 2.

Lungs/Pleura: Left-sided pleural thickening without significant
pleural fluid. Trace right-sided pleural air is of indeterminate
acuity, including laterally adjacent the right upper lobe on image
39/series 3. Likely loculated secondary to pleural-based adhesions.

Lucency at the left apex could also represent small volume loculated
pleural air (example coronal image 63). Or be related to a dominant
bleb.

irregularity of the bronchial tree, greater on the left than right.

Extensive paraseptal emphysema. Trace apparent air within the
tracheal wall including on image 39/series 3. Suspicion of small
volume pneumomediastinum, difficult to differentiate from presumed
adjacent upper lobe bulla and blebs.

Moderate volume loss in the left upper lobe. Left greater than right
areas of peribronchovascular nodularity. Bronchiectasis which is
most significant in the left upper lobe, but identified throughout
both lungs. Patchy ground-glass opacities in a peribronchovascular
distribution of the right lower lobe.

More masslike opacity in the left apex including at 5.2 x 3.1 cm on
image 49/series 3.

Upper Abdomen: Normal imaged portions of the liver, spleen, stomach,
pancreas, adrenal glands. Bilateral left larger than right renal
collecting system calculi.

Musculoskeletal: No acute osseous abnormality.
IMPRESSION: 1. Left greater than right and upper lobe predominant bronchiectasis
with peribronchovascular nodularity and scattered ground-glass
opacity. All likely related to infection, including atypical
etiologies. More masslike opacity in the left upper lobe is also
likely infectious, given the appearance of the remainder of the
chest. Resolved volume loss in the upper left hemi thorax.
2. Abnormal appearance of the trachea and mediastinum, favored to be
chronic. Lucencies along the trachea could partially be due to
medial pleural based blebs. Small volume pneumomediastinum cannot be
excluded. There is also probable air within the tracheal wall, of
indeterminate acuity.
3. Trace loculated right-sided pneumothorax, of indeterminate
acuity. Left apical lucency could also represent minimal loculated
pleural air or dominant left apical bleb.
4. Markedly abnormal appearance of the esophagus, with debris or
fluid throughout. Correlate with dysmotility and consider
nonemergent esophagram.
These results will be called to the ordering clinician or
representative by the Radiologist Assistant, and communication
documented in the PACS or zVision Dashboard.

## 2018-09-06 IMAGING — RF DG ESOPHAGUS
5 series · 13 of 13 positions shown · non-contrast
Comparison: CT scan of the chest April 19, 2016

CLINICAL DATA: Evaluate for aspiration. History of bronchiectasis
residual food in the esophagus.

EXAM:
ESOPHOGRAM/BARIUM SWALLOW
TECHNIQUE: Single contrast examination was performed using thin barium or water
soluble. A barium tablet was administered as well
FLUOROSCOPY TIME:  Fluoroscopy Time:  0 minutes, 30 seconds
Radiation Exposure Index (if provided by the fluoroscopic device):
413.37 micro Gy per meters squared
Number of Acquired Spot Images: 2+ 3 video loops

[Series 1: fluoro_barium 2fps_bw · 0.18mm/px · 4 of 20 frames shown (1 of 5)]
[frame 1/20]
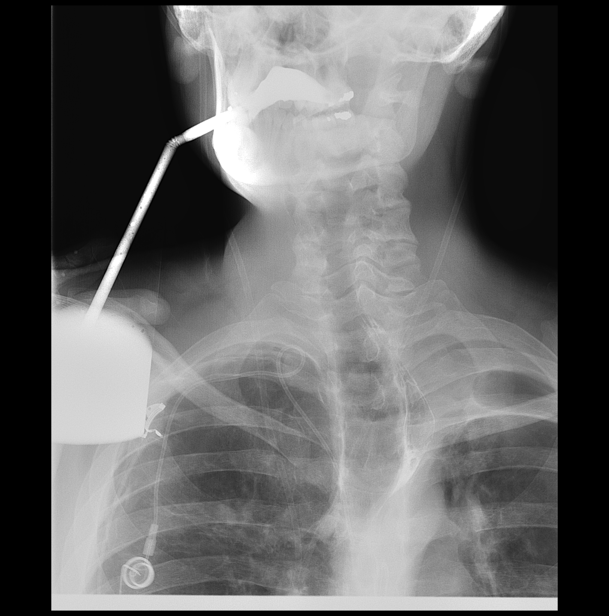
[frame 4/20]
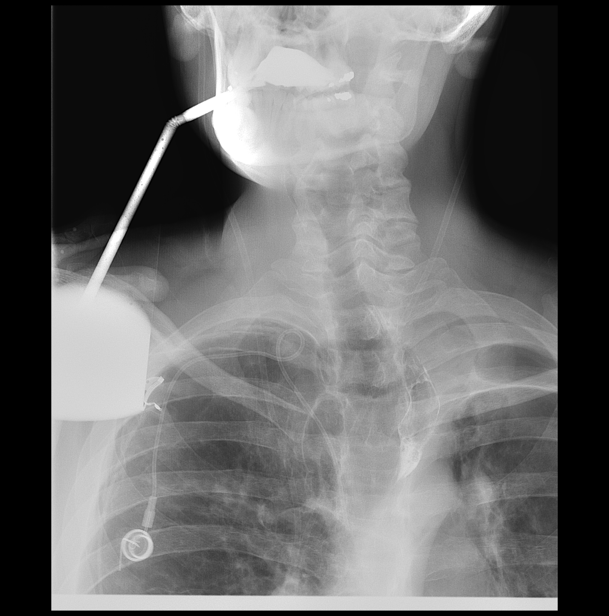
[frame 11/20]
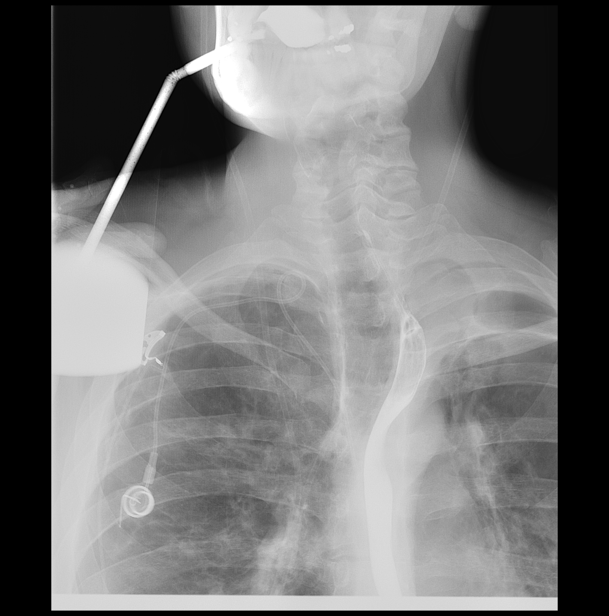
[frame 18/20]
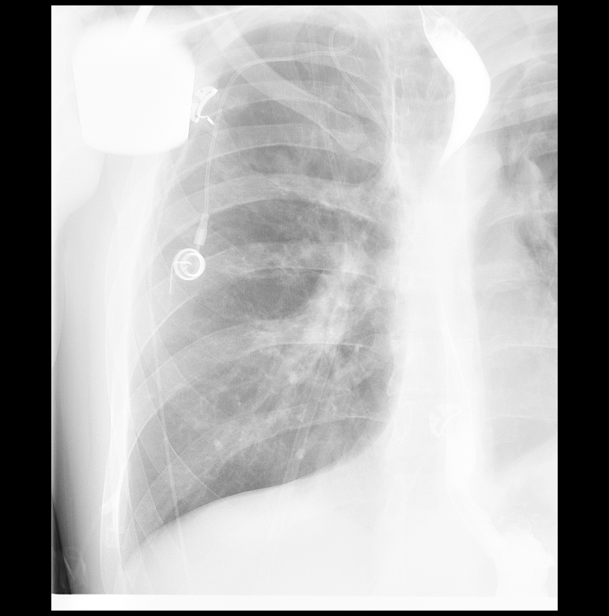

[Series 2: fluoro_barium 2fps_bw · 0.18mm/px · 1 of 1 slices shown (2 of 5)]
[im 1/1]
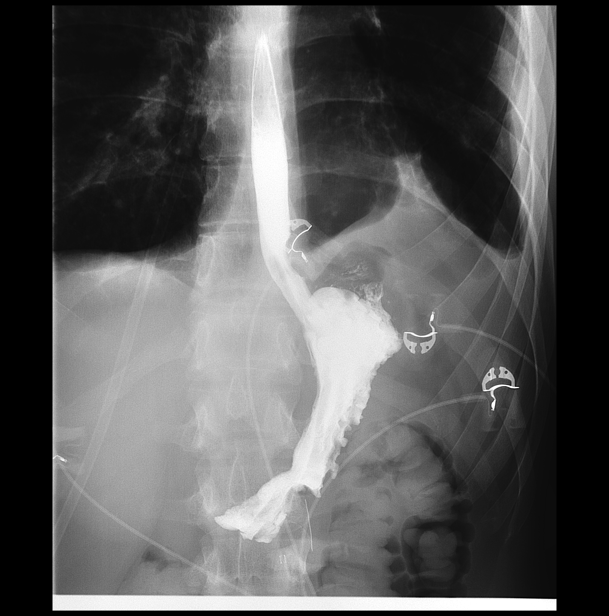

[Series 3: fluoro_barium 2fps_bw · 0.17mm/px · 1 of 1 slices shown (3 of 5)]
[im 1/1]
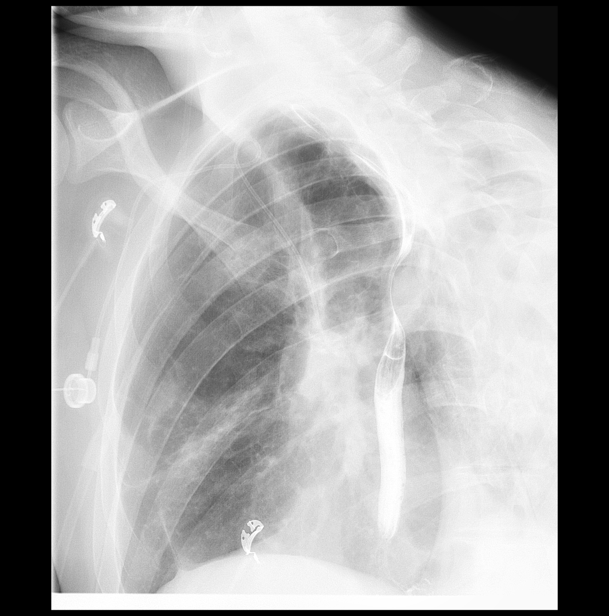

[Series 4: fluoro_barium 2fps_bw · 0.18mm/px · 4 of 18 frames shown (4 of 5)]
[frame 3/18]
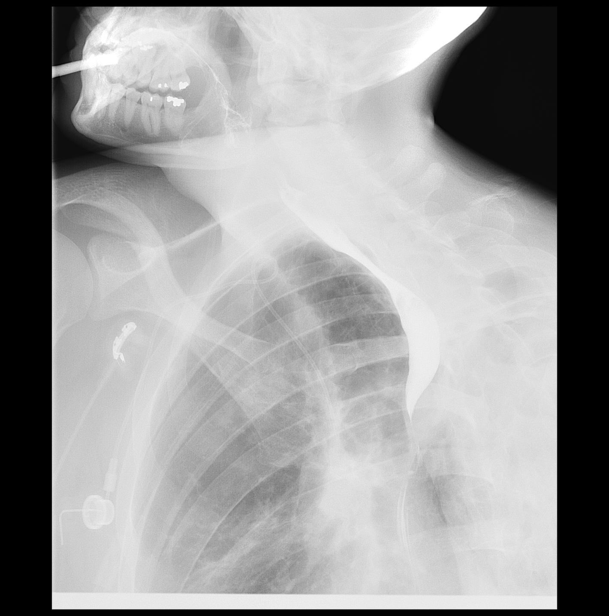
[frame 10/18]
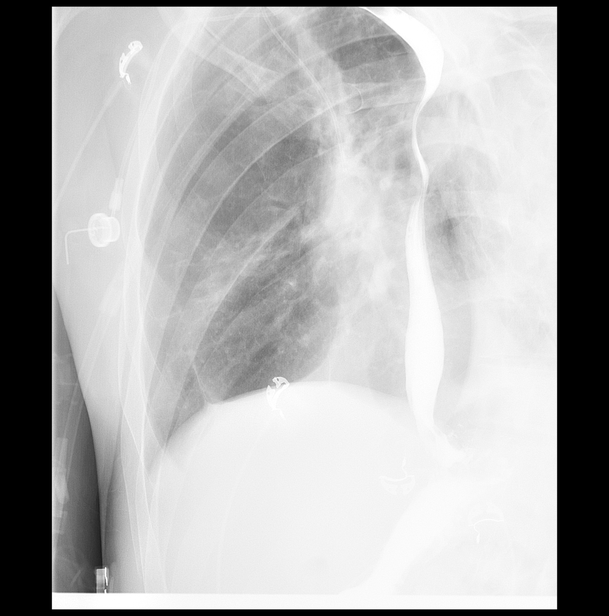
[frame 14/18]
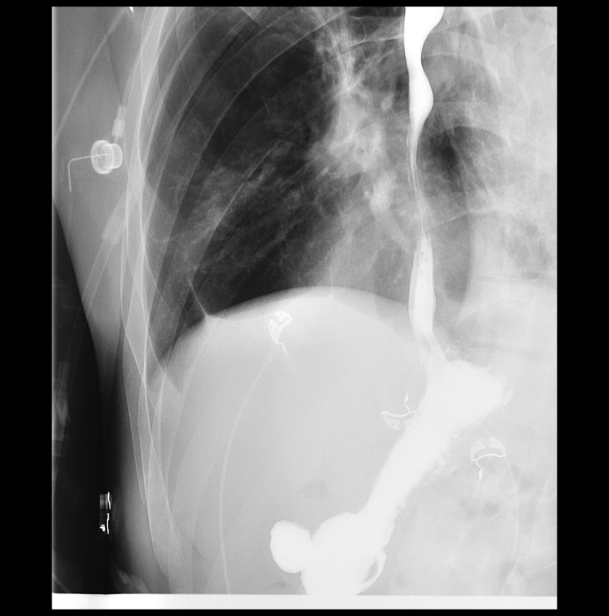
[frame 16/18]
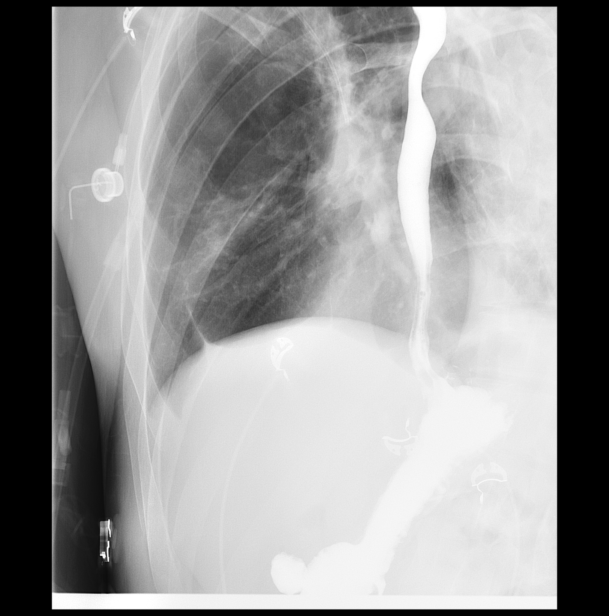

[Series 5: fluoro_barium 2fps_bw · 0.18mm/px · 3 of 10 frames shown (5 of 5)]
[frame 2/10]
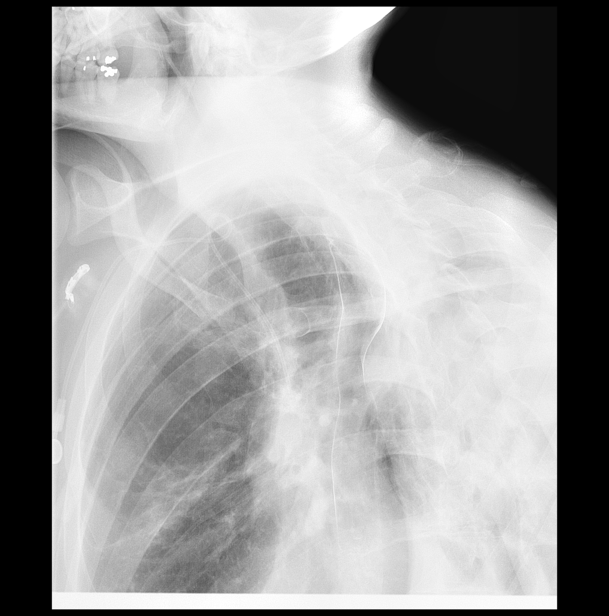
[frame 6/10]
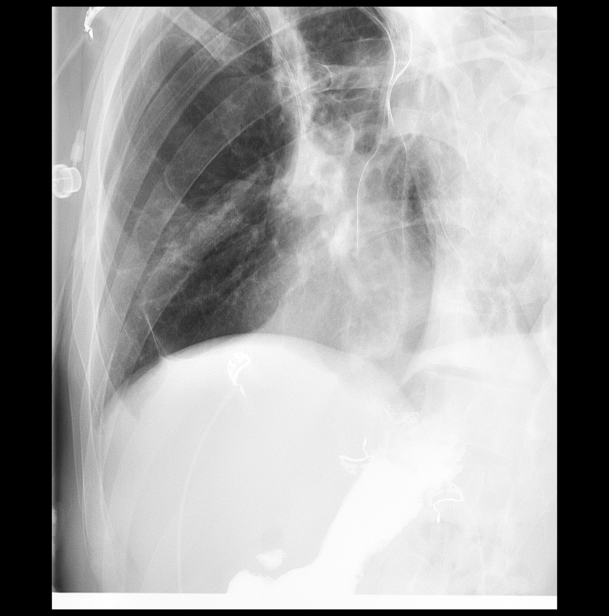
[frame 9/10]
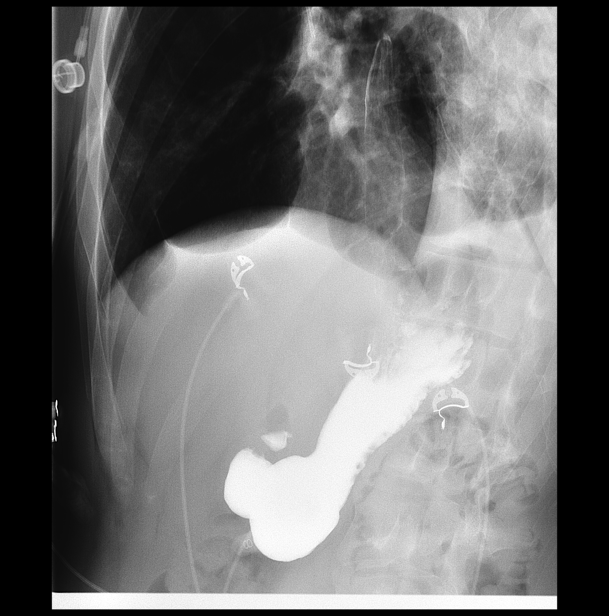

[13 of 13 positions shown; findings below may reference images not displayed]

FINDINGS: The patient ingested thin barium through a straw all in the semi
recumbent position. The initiation of the voluntary portion of the
swallowing maneuver was normal. No aspiration was observed. The
thoracic esophagus distended well. No retained food was noted within
the esophagus. Esophageal motility appeared normal. The barium
tablet passed promptly from the mouth to the stomach.
IMPRESSION: No evidence of aspiration. Deviation of the trachea toward the left
at the thoracic inlet likely due to the dilated mid trachea
demonstrated on the CT scan. No retained food particles within the
esophagus. No stricture or abnormal dilation of the esophagus.
Normal peristalsis. The barium tablet passed promptly from the mouth
to the stomach.
